# Patient Record
Sex: Female | Born: 1960 | Race: White | Hispanic: No | Marital: Married | Smoking: Never smoker
Health system: Southern US, Community
[De-identification: ages and names within clinical notes are randomized; demographics above are authoritative.]

## PROBLEM LIST (undated history)

## (undated) DIAGNOSIS — B019 Varicella without complication: Secondary | ICD-10-CM

## (undated) DIAGNOSIS — D509 Iron deficiency anemia, unspecified: Secondary | ICD-10-CM

## (undated) DIAGNOSIS — B029 Zoster without complications: Secondary | ICD-10-CM

## (undated) DIAGNOSIS — I10 Essential (primary) hypertension: Secondary | ICD-10-CM

## (undated) DIAGNOSIS — H44003 Unspecified purulent endophthalmitis, bilateral: Secondary | ICD-10-CM

## (undated) DIAGNOSIS — Z87440 Personal history of urinary (tract) infections: Secondary | ICD-10-CM

## (undated) HISTORY — DX: Personal history of urinary (tract) infections: Z87.440

## (undated) HISTORY — PX: GASTRIC BYPASS: SHX52

## (undated) HISTORY — PX: TONSILLECTOMY AND ADENOIDECTOMY: SHX28

## (undated) HISTORY — DX: Iron deficiency anemia, unspecified: D50.9

## (undated) HISTORY — DX: Varicella without complication: B01.9

## (undated) HISTORY — DX: Essential (primary) hypertension: I10

## (undated) HISTORY — PX: ABDOMINAL HYSTERECTOMY: SHX81

## (undated) HISTORY — DX: Zoster without complications: B02.9

---
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
## 2006-03-25 HISTORY — PX: GASTRIC BYPASS: SHX52

## 2008-03-25 HISTORY — PX: ABDOMINAL HYSTERECTOMY: SHX81

## 2012-06-26 LAB — HM COLONOSCOPY

## 2014-04-06 LAB — HM MAMMOGRAPHY: HM MAMMO: NORMAL

## 2014-08-23 ENCOUNTER — Encounter: Payer: Self-pay | Admitting: Primary Care

## 2014-08-23 ENCOUNTER — Ambulatory Visit (INDEPENDENT_AMBULATORY_CARE_PROVIDER_SITE_OTHER): Payer: BLUE CROSS/BLUE SHIELD | Admitting: Primary Care

## 2014-08-23 ENCOUNTER — Encounter (INDEPENDENT_AMBULATORY_CARE_PROVIDER_SITE_OTHER): Payer: Self-pay

## 2014-08-23 VITALS — BP 116/82 | HR 65 | Temp 98.0°F | Ht 68.0 in | Wt 242.8 lb

## 2014-08-23 DIAGNOSIS — E669 Obesity, unspecified: Secondary | ICD-10-CM | POA: Insufficient documentation

## 2014-08-23 DIAGNOSIS — D509 Iron deficiency anemia, unspecified: Secondary | ICD-10-CM

## 2014-08-23 NOTE — Patient Instructions (Signed)
I will obtain records from your prior provider and will schedule a physical once I've had a chance to review. You may try Debrox Drops (over the counter) for ear wax removal.  It was a pleasure to meet you today! Please don't hesitate to call me with any questions. Welcome to [facility]!

## 2014-08-23 NOTE — Progress Notes (Signed)
Subjective:    Patient ID: Judy Price, female    DOB: 06-Jun-1960, 54 y.o.   MRN: [id number]  HPI  Judy Price is a 54 year old female who presents today to establish care and discuss the problems mentioned below. Will obtain old records.  1) Elevated blood pressure readings: She's experienced elevated blood pressure readings prior to her gastric bypass surgery in 2006, but has not had elevated readings since. She's never been managed on medication. Denies chest pain, headaches, blurred vision.  2) Obesity: Gastric Bypass completed in 2006. Gradual weight gain of 40 pounds over the last year which she attributes to stress eating and lack of activity. She plans on focusing on weight loss efforts now that the move from [place] is complete.  3) Iron deficiency anemia: Present since bypass. Iron infusion last year. Reports some fatigue, but not as she did when she required infusions.  Review of Systems  Constitutional: Negative for fatigue and unexpected weight change.  HENT: Negative for rhinorrhea.   Respiratory: Negative for cough and shortness of breath.   Gastrointestinal: Negative for diarrhea and constipation.  Genitourinary: Negative for dysuria and frequency.  Musculoskeletal: Negative for myalgias and arthralgias.  Skin: Negative for rash.  Allergic/Immunologic: Negative for environmental allergies.  Neurological: Negative for dizziness and headaches.  Psychiatric/Behavioral:       Denies concerns for anxiety or depression       Past Medical History  Diagnosis Date  . Chicken pox   . Hypertension   . History of UTI   . Iron deficiency anemia     History   Social History  . Marital Status: Married    Spouse Name: N/A  . Number of Children: N/A  . Years of Education: N/A   Occupational History  . Not on file.   Social History Main Topics  . Smoking status: Never Smoker   . Smokeless tobacco: Not on file  . Alcohol Use: No  . Drug Use: No  . Sexual  Activity: Not on file   Other Topics Concern  . Not on file   Social History Narrative   Married.   Moved from [place].   One child, one grandchild.   Home maker   Enjoys decorating, making jewelry.       Past Surgical History  Procedure Laterality Date  . Gastric bypass    . Abdominal hysterectomy    . Tonsillectomy and adenoidectomy      Family History  Problem Relation Age of Onset  . Arthritis Mother   . Cancer Mother     Breast tumors  . Hyperlipidemia Mother   . Heart disease Mother   . Stroke Mother   . Hypertension Mother   . Hyperlipidemia Father   . Heart disease Father   . Stroke Father   . Arthritis Maternal Grandmother   . Cancer Maternal Grandmother     Breast  . Hyperlipidemia Maternal Grandmother   . Heart disease Maternal Grandmother   . Stroke Maternal Grandmother   . Hypertension Maternal Grandmother   . Hyperlipidemia Maternal Grandfather   . Hyperlipidemia Paternal Grandmother   . Hyperlipidemia Paternal Grandfather     Allergies  Allergen Reactions  . Codeine     No current outpatient prescriptions on file prior to visit.   No current facility-administered medications on file prior to visit.    BP 116/82 mmHg  Pulse 65  Temp(Src) 98 F (36.7 C) (Oral)  Ht 5\' 8"  (1.727 m)  Wt 242  lb 12.8 oz (110.133 kg)  BMI 36.93 kg/m2  SpO2 97%    Objective:   Physical Exam  Constitutional: She is oriented to person, place, and time. She appears well-nourished.  HENT:  Right Ear: Tympanic membrane and ear canal normal.  Left Ear: Tympanic membrane and ear canal normal.  Mouth/Throat: Oropharynx is clear and moist.  Cardiovascular: Normal rate and regular rhythm.   Pulmonary/Chest: Effort normal and breath sounds normal.  Neurological: She is alert and oriented to person, place, and time.  Skin: Skin is warm and dry.  Psychiatric: She has a normal mood and affect.          Assessment & Plan:

## 2014-08-23 NOTE — Progress Notes (Signed)
Pre visit review using our clinic review tool, if applicable. No additional management support is needed unless otherwise documented below in the visit note. 

## 2014-08-23 NOTE — Assessment & Plan Note (Signed)
Gastric bypass in 2006. Steady weight gain of 40 pounds in last year. She is aware of the need for weight loss and exercise and plans on re-starting now that she's moved. Will continue to monitor.

## 2014-08-23 NOTE — Assessment & Plan Note (Signed)
Present since bypass in 2006. Last iron infusion was 1 year ago. Will obtain CBC next visit. Some fatigue but overall feels well.

## 2014-11-23 ENCOUNTER — Telehealth: Payer: Self-pay

## 2014-11-23 NOTE — Telephone Encounter (Signed)
I called and spoke with patient to remind her of being due for a mammogram. Patient states that she recently moved here from [place] and that she had a mammogram at [facility] in [place]. She states that she had it done some time in the middle of January.

## 2014-12-01 ENCOUNTER — Other Ambulatory Visit: Payer: Self-pay | Admitting: *Deleted

## 2014-12-01 MED ORDER — ESTROGENS CONJUGATED 0.625 MG PO TABS: 0.6250 mg | ORAL_TABLET | Freq: Every day | ORAL | Status: DC

## 2014-12-01 NOTE — Telephone Encounter (Signed)
Pt left voicemail at Triage requesting Rx to be filled, done and pt notified

## 2015-01-24 ENCOUNTER — Other Ambulatory Visit: Payer: Self-pay

## 2015-01-24 ENCOUNTER — Telehealth: Payer: Self-pay | Admitting: Primary Care

## 2015-01-24 NOTE — Telephone Encounter (Signed)
Received a refill request for Premarin. I do not manage this medication. Someone must have authorized refills during my absence in September.  Judy Price, What is she taking this medication for? This is something that GYN will prescribe typically.

## 2015-01-24 NOTE — Telephone Encounter (Signed)
Pt left v/m requesting printed rx for premarin; pt can recently got filled at local pharmacy but pt can get less expensive on line. Pt request cb when ready for pick up.

## 2015-01-26 ENCOUNTER — Telehealth: Payer: Self-pay | Admitting: Primary Care

## 2015-01-26 NOTE — Telephone Encounter (Signed)
Called patient. Patient stated she understand [name]'s comments. Patient wanted to know if Jae DireKate would printed out a prescription so she could get it online where it is less expensive. Patient is aware that a GYN typically prescribed this and this medication helps her with hot flashes.

## 2015-01-26 NOTE — Telephone Encounter (Deleted)
Called patient. Patient stated that she wanted a printed written precription

## 2015-01-26 NOTE — Telephone Encounter (Signed)
How long has she been on this medication? Is she aware of the risks of taking this medication which include breast cancer, endometrial cancer, heart disease, and dementia? There are other options out there for management of hot flashes that are less risky. Has she tried anything else in the past?

## 2015-01-27 ENCOUNTER — Telehealth: Payer: Self-pay | Admitting: Primary Care

## 2015-01-27 ENCOUNTER — Other Ambulatory Visit: Payer: Self-pay | Admitting: Primary Care

## 2015-01-27 DIAGNOSIS — N951 Menopausal and female climacteric states: Secondary | ICD-10-CM

## 2015-01-27 MED ORDER — ESTROGENS CONJUGATED 0.45 MG PO TABS: ORAL_TABLET | ORAL | Status: DC

## 2015-01-27 NOTE — Telephone Encounter (Signed)
Noted. She is due for a physical and needs follow up for premarin, please have her schedule with me in 3 months or sooner if she desires. We can do the physical and follow up together. I have reduced her dose. She will continue that same dose for 3 months and will reduce it again after that. She will need close follow up. Thanks.

## 2015-01-27 NOTE — Telephone Encounter (Signed)
Called and spoken to patient. Patient have been taking this medication for about 6 years. She is aware of the risks but since they are so bad that's why she continue to take medication. Patient have tried natural remedies but no other prescriptions. Patient is open to try other things.

## 2015-01-27 NOTE — Telephone Encounter (Signed)
Called patient and she stated that she takes it every day. Notified patient that she can pick up in front office.

## 2015-01-27 NOTE — Telephone Encounter (Signed)
Will you please call Judy Price and verify how often she was taking her Premarin? Every day? Every other day? Also, her new script is ready for pick up.

## 2015-01-31 NOTE — Telephone Encounter (Signed)
Called and notified patient of [name]'s comments. Patient verbalized understanding. Patient will call later to schedule appointment.

## 2015-03-23 ENCOUNTER — Encounter: Payer: Self-pay | Admitting: Family Medicine

## 2015-03-23 ENCOUNTER — Ambulatory Visit (INDEPENDENT_AMBULATORY_CARE_PROVIDER_SITE_OTHER): Payer: BLUE CROSS/BLUE SHIELD | Admitting: Family Medicine

## 2015-03-23 VITALS — BP 110/70 | HR 56 | Temp 98.1°F | Ht 68.0 in

## 2015-03-23 DIAGNOSIS — M7501 Adhesive capsulitis of right shoulder: Secondary | ICD-10-CM | POA: Diagnosis not present

## 2015-03-23 DIAGNOSIS — M25511 Pain in right shoulder: Secondary | ICD-10-CM | POA: Diagnosis not present

## 2015-03-23 MED ORDER — METHYLPREDNISOLONE ACETATE 40 MG/ML IJ SUSP
80.0000 mg | Freq: Once | INTRAMUSCULAR | Status: AC
  Administered 2015-03-23: 80 mg via INTRA_ARTICULAR

## 2015-03-23 NOTE — Progress Notes (Signed)
Dr. Karleen HampshireSpencer T. Mico Spark, MD, CAQ Sports Medicine Primary Care and Sports Medicine [address] [place], [address] Phone: [phone] Fax: [phone]  03/23/2015  Patient: Judy Price, MRN: [id number], DOB: 1960-05-12, 54 y.o.  Primary Physician:  Morrie Sheldonlark,Katherine Kendal, NP   Chief Complaint  Patient presents with  . Shoulder Pain    Right-Wants Injection   Subjective:   Judy Price is a 54 y.o. very pleasant female patient who presents with the following: shoulder pain  Consulting provider: Mrs. Mayra ReelKate Clark, NP Reason: R shoulder pain  The patient noted above presents with shoulder pain that has been ongoing for 8 months. there is no history of trauma or accident other than moving some boxes without pain. The patient denies neck pain or radicular symptoms. No shoulder blade pain Denies dislocation, subluxation, separation of the shoulder. The patient does complain of pain with flexion, abduction, and terminal motion.  Significant restriction of motion. she describes a deep ache around the shoulder, and sometimes it will wake the patient up at night.  Moved in may and hurt ever since. Had a L shoulder injection in the remote past.   Gotten worse since May. R.  Still looking for employment. [place]   No surgery or fracture.  Not sure of an injury.   Medications Tried: OTC Tylenol and NSAIDS Ice or Heat: minimal help Tried PT: No  Prior shoulder Injury: No Prior surgery: No Prior fracture: No  Past Medical History, Surgical History, Social History, Family History, Medications, and allergies reviewed and updated if relevant.   Patient Active Problem List   Diagnosis Date Noted  . Obesity (BMI 35.0-39.9 without comorbidity) (HCC) 08/23/2014  . Anemia, iron deficiency 08/23/2014    Past Medical History  Diagnosis Date  . Chicken pox   . Hypertension   . History of UTI   . Iron deficiency anemia     Past Surgical History    Procedure Laterality Date  . Gastric bypass    . Abdominal hysterectomy    . Tonsillectomy and adenoidectomy      Social History   Social History  . Marital Status: Married    Spouse Name: N/A  . Number of Children: N/A  . Years of Education: N/A   Occupational History  . Not on file.   Social History Main Topics  . Smoking status: Never Smoker   . Smokeless tobacco: Never Used  . Alcohol Use: No  . Drug Use: No  . Sexual Activity: Not on file   Other Topics Concern  . Not on file   Social History Narrative   Married.   Moved from [place].   One child, one grandchild.   Home maker   Enjoys decorating, making jewelry.       Family History  Problem Relation Age of Onset  . Arthritis Mother   . Cancer Mother     Breast tumors  . Hyperlipidemia Mother   . Heart disease Mother   . Stroke Mother   . Hypertension Mother   . Hyperlipidemia Father   . Heart disease Father   . Stroke Father   . Arthritis Maternal Grandmother   . Cancer Maternal Grandmother     Breast  . Hyperlipidemia Maternal Grandmother   . Heart disease Maternal Grandmother   . Stroke Maternal Grandmother   . Hypertension Maternal Grandmother   . Hyperlipidemia Maternal Grandfather   . Hyperlipidemia Paternal Grandmother   . Hyperlipidemia Paternal Grandfather  Allergies  Allergen Reactions  . Codeine     Medication list reviewed and updated in full in [facility].  GEN: No fevers, chills. Nontoxic. Primarily MSK c/o today. MSK: Detailed in the HPI GI: tolerating PO intake without difficulty Neuro: No numbness, parasthesias, or tingling associated. Otherwise the pertinent positives of the ROS are noted above.    Objective:   Blood pressure 110/70, pulse 56, temperature 98.1 F (36.7 C), temperature source Oral, height  (1.727 m).  GEN: Well-developed,well-nourished,in no acute distress; alert,appropriate and cooperative throughout examination HEENT:  Normocephalic and atraumatic without obvious abnormalities. Ears, externally no deformities PULM: Breathing comfortably in no respiratory distress EXT: No clubbing, cyanosis, or edema PSYCH: Normally interactive. Cooperative during the interview. Pleasant. Friendly and conversant. Not anxious or depressed appearing. Normal, full affect.  Shoulder: R Inspection: No muscle wasting or winging Ecchymosis/edema: neg  AC joint, scapula, clavicle: NT Cervical spine: NT, full ROM Spurling's: neg Abduction: 5/5, limited to 135 Flexion: 5/5, 135 deg IR, full, lift-off: 5/5, minimal in 90 abd ER at neutral: 5/5, lacks 20 deg compared to contralateral side AC crossover and compression: unable to complete Additional special testing is equivocal given lack of motion C5-T1 intact Sensation intact Grip 5/5  Assessment and Plan:   Adhesive capsulitis, right  Shoulder pain, right - Plan: methylPREDNISolone acetate (DEPO-MEDROL) injection 80 mg  >25 minutes spent in face to face time with patient, >50% spent in counselling or coordination of care  Patient was given a systematic ROM protocol from [employer] to be done daily. Emphasized importance of adherence, help of PT, daily HEP. In a well-informed young patient, HEP is a reasonable first step.  The average length of total symptoms is 12-18 months going through 3 different phases in the freezing and thawing process. Reviewed all with patient. I believe this is secondary, and she hopefully will thaw faster.   Tylenol or NSAID of choice prn for pain relief Intraarticular shoulder injections discussed with patient, which have good evidence for accelerating the thawing phase.  Will need RTC str and scapular stabilization to fix underlying mechanics.  I appreciate the opportunity to evaluate this very friendly patient. If you have any question regarding her care or prognosis, do not hesitate to ask.   Intrarticular Shoulder Injection, R Verbal consent  was obtained from the patient. Risks including infection explained and contrasted with benefits and alternatives. Patient prepped with Chloraprep and Ethyl Chloride used for anesthesia. An intraarticular shoulder injection was performed using the posterior approach. The patient tolerated the procedure well and had decreased pain post injection. No complications. Injection: 8 cc of Lidocaine 1% and 2 mL Depo-Medrol 40 mg. Needle: 22 gauge   Follow-up: 2 months  Signed,  Kayline Sheer T. Stokely Jeancharles, MD   Patient's Medications  New Prescriptions   No medications on file  Previous Medications   ESTROGENS, CONJUGATED, (PREMARIN) 0.45 MG TABLET    Take 1 tablet by mouth daily.  Modified Medications   No medications on file  Discontinued Medications   No medications on file

## 2015-03-23 NOTE — Progress Notes (Signed)
Pre visit review using our clinic review tool, if applicable. No additional management support is needed unless otherwise documented below in the visit note. 

## 2015-06-08 ENCOUNTER — Encounter: Payer: Self-pay | Admitting: Family Medicine

## 2015-06-08 ENCOUNTER — Ambulatory Visit (INDEPENDENT_AMBULATORY_CARE_PROVIDER_SITE_OTHER): Payer: BLUE CROSS/BLUE SHIELD | Admitting: Family Medicine

## 2015-06-08 VITALS — BP 104/62 | HR 58 | Temp 97.5°F | Ht 68.0 in

## 2015-06-08 DIAGNOSIS — M25511 Pain in right shoulder: Secondary | ICD-10-CM

## 2015-06-08 DIAGNOSIS — M7501 Adhesive capsulitis of right shoulder: Secondary | ICD-10-CM

## 2015-06-08 MED ORDER — METHYLPREDNISOLONE ACETATE 40 MG/ML IJ SUSP
80.0000 mg | Freq: Once | INTRAMUSCULAR | Status: AC
  Administered 2015-06-08: 80 mg via INTRA_ARTICULAR

## 2015-06-08 NOTE — Progress Notes (Signed)
Dr. Karleen HampshireSpencer T. Cory Kitt, MD, CAQ Sports Medicine Primary Care and Sports Medicine [address] [place], [address] Phone: [phone] Fax: [phone]  06/08/2015  Patient: Judy Price, MRN: [id number], DOB: [birth year], 55 y.o.  Primary Physician:  Morrie Sheldonlark,Katherine Kendal, NP   Chief Complaint  Patient presents with  . Follow-up    Right Shoulder Pain   Subjective:   Judy Mawamela Low is a 55 y.o. very pleasant female patient who presents with the following: shoulder pain  Frozen shoulder Getting much better. Compliant with HEP and stretching S/p intrartic shoulder inj x 1  O/w she is doing well.  03/23/2015 Last OV with Hannah BeatSpencer Crystal Ellwood, MD  Consulting provider: Mrs. Mayra ReelKate Clark, NP Reason: R shoulder pain  The patient noted above presents with shoulder pain that has been ongoing for 8 months. there is no history of trauma or accident other than moving some boxes without pain. The patient denies neck pain or radicular symptoms. No shoulder blade pain Denies dislocation, subluxation, separation of the shoulder. The patient does complain of pain with flexion, abduction, and terminal motion.  Significant restriction of motion. she describes a deep ache around the shoulder, and sometimes it will wake the patient up at night.  Moved in may and hurt ever since. Had a L shoulder injection in the remote past.   Gotten worse since May. R.  Still looking for employment. [place]   No surgery or fracture.  Not sure of an injury.   Medications Tried: OTC Tylenol and NSAIDS Ice or Heat: minimal help Tried PT: No  Prior shoulder Injury: No Prior surgery: No Prior fracture: No  Past Medical History, Surgical History, Social History, Family History, Medications, and allergies reviewed and updated if relevant.   Patient Active Problem List   Diagnosis Date Noted  . Obesity (BMI 35.0-39.9 without comorbidity) (HCC) 08/23/2014  . Anemia, iron deficiency  08/23/2014    Past Medical History  Diagnosis Date  . Chicken pox   . Hypertension   . History of UTI   . Iron deficiency anemia     Past Surgical History  Procedure Laterality Date  . Gastric bypass    . Abdominal hysterectomy    . Tonsillectomy and adenoidectomy      Social History   Social History  . Marital Status: Married    Spouse Name: N/A  . Number of Children: N/A  . Years of Education: N/A   Occupational History  . Not on file.   Social History Main Topics  . Smoking status: Never Smoker   . Smokeless tobacco: Never Used  . Alcohol Use: No  . Drug Use: No  . Sexual Activity: Not on file   Other Topics Concern  . Not on file   Social History Narrative   Married.   Moved from [place].   One child, one grandchild.   Home maker   Enjoys decorating, making jewelry.       Family History  Problem Relation Age of Onset  . Arthritis Mother   . Cancer Mother     Breast tumors  . Hyperlipidemia Mother   . Heart disease Mother   . Stroke Mother   . Hypertension Mother   . Hyperlipidemia Father   . Heart disease Father   . Stroke Father   . Arthritis Maternal Grandmother   . Cancer Maternal Grandmother     Breast  . Hyperlipidemia Maternal Grandmother   . Heart disease Maternal Grandmother   .  Stroke Maternal Grandmother   . Hypertension Maternal Grandmother   . Hyperlipidemia Maternal Grandfather   . Hyperlipidemia Paternal Grandmother   . Hyperlipidemia Paternal Grandfather     Allergies  Allergen Reactions  . Codeine     Medication list reviewed and updated in full in [facility].  GEN: No fevers, chills. Nontoxic. Primarily MSK c/o today. MSK: Detailed in the HPI GI: tolerating PO intake without difficulty Neuro: No numbness, parasthesias, or tingling associated. Otherwise the pertinent positives of the ROS are noted above.    Objective:   Blood pressure 104/62, pulse 58, temperature 97.5 F (36.4 C), temperature source  Oral, height  (1.727 m).  GEN: Well-developed,well-nourished,in no acute distress; alert,appropriate and cooperative throughout examination HEENT: Normocephalic and atraumatic without obvious abnormalities. Ears, externally no deformities PULM: Breathing comfortably in no respiratory distress EXT: No clubbing, cyanosis, or edema PSYCH: Normally interactive. Cooperative during the interview. Pleasant. Friendly and conversant. Not anxious or depressed appearing. Normal, full affect.  Shoulder: R Inspection: No muscle wasting or winging Ecchymosis/edema: neg  AC joint, scapula, clavicle: NT Cervical spine: NT, full ROM Spurling's: neg Abduction: 5/5, limited to 170 Flexion: 5/5, 165 deg IR, full, lift-off: 5/5, minimal in 90 abd ER at neutral: 5/5, lacks 5 deg compared to contralateral side AC crossover and compression: unable to complete Additional special testing is equivocal given lack of motion C5-T1 intact Sensation intact Grip 5/5  Assessment and Plan:   Adhesive capsulitis, right  Right shoulder pain - Plan: methylPREDNISolone acetate (DEPO-MEDROL) injection 80 mg  ROM is doing much better, compliant with HEP Reinject shoulder again today  F/u prn or with probs  Intrarticular Shoulder Injection, R Verbal consent was obtained from the patient. Risks including infection explained and contrasted with benefits and alternatives. Patient prepped with Chloraprep and Ethyl Chloride used for anesthesia. An intraarticular shoulder injection was performed using the posterior approach. The patient tolerated the procedure well and had decreased pain post injection. No complications. Injection: 8 cc of Lidocaine 1% and 2 mL Depo-Medrol 40 mg. Needle: 22 gauge   Follow-up: 2 months  Signed,  Kourtlyn Charlet T. Arty Lantzy, MD   Patient's Medications  New Prescriptions   No medications on file  Previous Medications   ESTROGENS, CONJUGATED, (PREMARIN) 0.45 MG TABLET    Take 1 tablet by  mouth daily.  Modified Medications   No medications on file  Discontinued Medications   No medications on file

## 2015-06-08 NOTE — Progress Notes (Signed)
Pre visit review using our clinic review tool, if applicable. No additional management support is needed unless otherwise documented below in the visit note. 

## 2015-07-25 ENCOUNTER — Other Ambulatory Visit: Payer: Self-pay | Admitting: Primary Care

## 2015-07-25 ENCOUNTER — Telehealth: Payer: Self-pay | Admitting: Primary Care

## 2015-07-25 NOTE — Telephone Encounter (Signed)
Electronically refill request for   estrogens, conjugated, (PREMARIN) 0.45 MG tablet    Last prescribed on 01/27/2015. Last seen on 06/08/2015. No future appointment.

## 2015-07-25 NOTE — Telephone Encounter (Signed)
Message left for patient to return my call.  

## 2015-07-25 NOTE — Telephone Encounter (Signed)
Received refill request for Premarin. As mentioned in my prior notes I would like to wean her off of this medication and as long term management puts her at risk. She was to schedule a follow up appointment to discuss, but never did. I would like to further reduce the strenth of her medication by sending in Premarin 0.3 mg. I would like for her to take this dose for 3 months and notify me if she develops any symptoms. Does she wish to have this script printed like her last one?

## 2015-07-27 ENCOUNTER — Other Ambulatory Visit: Payer: Self-pay | Admitting: Primary Care

## 2015-07-27 DIAGNOSIS — N951 Menopausal and female climacteric states: Secondary | ICD-10-CM

## 2015-07-27 MED ORDER — ESTROGENS CONJUGATED 0.3 MG PO TABS: 0.3000 mg | ORAL_TABLET | Freq: Every day | ORAL | Status: DC

## 2015-07-27 NOTE — Telephone Encounter (Signed)
Noted.  Rx sent.

## 2015-07-27 NOTE — Telephone Encounter (Signed)
Called and notified patient of [name]'s comments. Patient verbalized understanding.  Patient agrees to the premarin 0.3 mg and would like it sent to the [facility]. Will call back later for the follow up.

## 2015-09-29 ENCOUNTER — Ambulatory Visit (INDEPENDENT_AMBULATORY_CARE_PROVIDER_SITE_OTHER): Payer: BLUE CROSS/BLUE SHIELD | Admitting: Internal Medicine

## 2015-09-29 ENCOUNTER — Encounter: Payer: Self-pay | Admitting: Internal Medicine

## 2015-09-29 VITALS — BP 120/84 | HR 65 | Temp 98.2°F

## 2015-09-29 DIAGNOSIS — R1013 Epigastric pain: Secondary | ICD-10-CM | POA: Diagnosis not present

## 2015-09-29 LAB — CBC WITH DIFFERENTIAL/PLATELET
Basophils Absolute: 0 10*3/uL (ref 0.0–0.1)
Basophils Relative: 0.5 % (ref 0.0–3.0)
EOS PCT: 1.1 % (ref 0.0–5.0)
Eosinophils Absolute: 0.1 10*3/uL (ref 0.0–0.7)
HCT: 39 % (ref 36.0–46.0)
Hemoglobin: 13 g/dL (ref 12.0–15.0)
LYMPHS ABS: 1.4 10*3/uL (ref 0.7–4.0)
Lymphocytes Relative: 19.7 % (ref 12.0–46.0)
MCHC: 33.3 g/dL (ref 30.0–36.0)
MCV: 84.5 fl (ref 78.0–100.0)
MONO ABS: 0.6 10*3/uL (ref 0.1–1.0)
Monocytes Relative: 9 % (ref 3.0–12.0)
NEUTROS ABS: 5 10*3/uL (ref 1.4–7.7)
NEUTROS PCT: 69.7 % (ref 43.0–77.0)
PLATELETS: 252 10*3/uL (ref 150.0–400.0)
RBC: 4.61 Mil/uL (ref 3.87–5.11)
RDW: 15.4 % (ref 11.5–15.5)
WBC: 7.2 10*3/uL (ref 4.0–10.5)

## 2015-09-29 LAB — COMPREHENSIVE METABOLIC PANEL
ALT: 17 U/L (ref 0–35)
AST: 19 U/L (ref 0–37)
Albumin: 4.1 g/dL (ref 3.5–5.2)
Alkaline Phosphatase: 50 U/L (ref 39–117)
BUN: 20 mg/dL (ref 6–23)
CO2: 33 meq/L — AB (ref 19–32)
Calcium: 10.3 mg/dL (ref 8.4–10.5)
Chloride: 103 mEq/L (ref 96–112)
Creatinine, Ser: 0.8 mg/dL (ref 0.40–1.20)
GFR: 78.69 mL/min (ref >60.00)
GLUCOSE: 97 mg/dL (ref 70–99)
Potassium: 4.7 mEq/L (ref 3.5–5.1)
SODIUM: 139 meq/L (ref 135–145)
Total Bilirubin: 0.4 mg/dL (ref 0.2–1.2)
Total Protein: 7.2 g/dL (ref 6.0–8.3)

## 2015-09-29 LAB — IBC PANEL
Iron: 55 ug/dL (ref 42–145)
SATURATION RATIOS: 15.5 % — AB (ref 20.0–50.0)
Transferrin: 254 mg/dL (ref 212.0–360.0)

## 2015-09-29 LAB — VITAMIN B12

## 2015-09-29 MED ORDER — OMEPRAZOLE 20 MG PO CPDR: 20.0000 mg | DELAYED_RELEASE_CAPSULE | Freq: Every day | ORAL | Status: DC

## 2015-09-29 NOTE — Progress Notes (Signed)
   Subjective:    Patient ID: Judy Price, female    DOB: 31-Aug-1960, 55 y.o.   MRN: [id number]  HPI Here due to stomach burning  Has been burping and burning pain in epigastric area Started 2-3 days ago Has been taking protein shakes and only 1 meal a day for 3 months Has lost 30# with this Had Roux-n-Y bypass some years ago  Still on her bariatric surgery vitamins Needed iron infusions in the past  Regular caffeine in drinks No cigarettes or alcohol No N/V Bowels have been okay---no black or bloody stool  Tried tums last night--no sig help  Current Outpatient Prescriptions on File Prior to Visit  Medication Sig Dispense Refill  . estrogens, conjugated, (PREMARIN) 0.3 MG tablet Take 1 tablet (0.3 mg total) by mouth daily. 90 tablet 0   No current facility-administered medications on file prior to visit.    Allergies  Allergen Reactions  . Codeine     Past Medical History  Diagnosis Date  . Chicken pox   . Hypertension   . History of UTI   . Iron deficiency anemia     Past Surgical History  Procedure Laterality Date  . Gastric bypass    . Abdominal hysterectomy    . Tonsillectomy and adenoidectomy      Family History  Problem Relation Age of Onset  . Arthritis Mother   . Cancer Mother     Breast tumors  . Hyperlipidemia Mother   . Heart disease Mother   . Stroke Mother   . Hypertension Mother   . Hyperlipidemia Father   . Heart disease Father   . Stroke Father   . Arthritis Maternal Grandmother   . Cancer Maternal Grandmother     Breast  . Hyperlipidemia Maternal Grandmother   . Heart disease Maternal Grandmother   . Stroke Maternal Grandmother   . Hypertension Maternal Grandmother   . Hyperlipidemia Maternal Grandfather   . Hyperlipidemia Paternal Grandmother   . Hyperlipidemia Paternal Grandfather     Social History   Social History  . Marital Status: Married    Spouse Name: N/A  . Number of Children: N/A  . Years of Education: N/A    Occupational History  . Not on file.   Social History Main Topics  . Smoking status: Never Smoker   . Smokeless tobacco: Never Used  . Alcohol Use: No  . Drug Use: No  . Sexual Activity: Not on file   Other Topics Concern  . Not on file   Social History Narrative   Married.   [place]oved from [place].   One child, one grandchild.   Home maker   Enjoys decorating, making jewelry.      Review of Systems Feels good--just tired some No cough or  Appetite is okay    Objective:   Physical Exam  Constitutional: She appears well-developed. No distress.  Neck: Normal range of motion. No thyromegaly present.  Pulmonary/Chest: Effort normal and breath sounds normal. No respiratory distress. She has no wheezes. She has no rales.  Abdominal: Soft. Bowel sounds are normal. She exhibits no distension. There is no tenderness. There is no rebound and no guarding.  Lymphadenopathy:    She has no cervical adenopathy.          Assessment & Plan:

## 2015-09-29 NOTE — Assessment & Plan Note (Signed)
Sounds like acid related Will have her try to decrease the caffeine Trial of omeprazole --may only need for 2 weeks Will check labs also

## 2015-09-29 NOTE — Patient Instructions (Signed)
If your symptoms are better with the first couple of doses of medication, you can stop after 2 weeks. Cut back on the caffeine.

## 2015-09-29 NOTE — Progress Notes (Signed)
Pre visit review using our clinic review tool, if applicable. No additional management support is needed unless otherwise documented below in the visit note. 

## 2015-11-03 ENCOUNTER — Other Ambulatory Visit: Payer: Self-pay | Admitting: Primary Care

## 2015-11-03 DIAGNOSIS — N951 Menopausal and female climacteric states: Secondary | ICD-10-CM

## 2015-11-30 ENCOUNTER — Other Ambulatory Visit: Payer: Self-pay | Admitting: Primary Care

## 2015-11-30 DIAGNOSIS — N951 Menopausal and female climacteric states: Secondary | ICD-10-CM

## 2015-11-30 NOTE — Telephone Encounter (Signed)
Will send 30 days.  Per DPR, left detail message for patient to schedule a follow up.  Sending patient a MyChart message to schedule a follow up.  Sending a letter regarding the refill request.  Due to patient have not seen Jae DireKate regarding this issue over 15 months is the reason for comments above.

## 2015-12-31 ENCOUNTER — Other Ambulatory Visit: Payer: Self-pay | Admitting: Primary Care

## 2015-12-31 DIAGNOSIS — N951 Menopausal and female climacteric states: Secondary | ICD-10-CM

## 2016-01-02 NOTE — Telephone Encounter (Signed)
Ok to refill? Electronically refill request for   PREMARIN 0.3 MG tablet  Last prescribed on 11/30/2015. Last seen on 09/29/2015 wit acute with Dr Alphonsus SiasLetvak. Next appt on 01/03/2016.

## 2016-01-03 ENCOUNTER — Ambulatory Visit (INDEPENDENT_AMBULATORY_CARE_PROVIDER_SITE_OTHER): Payer: BLUE CROSS/BLUE SHIELD | Admitting: Primary Care

## 2016-01-03 ENCOUNTER — Encounter: Payer: Self-pay | Admitting: Primary Care

## 2016-01-03 VITALS — BP 106/64 | HR 62 | Temp 97.9°F | Ht 68.0 in | Wt 177.0 lb

## 2016-01-03 DIAGNOSIS — Z79890 Hormone replacement therapy (postmenopausal): Secondary | ICD-10-CM

## 2016-01-03 DIAGNOSIS — N951 Menopausal and female climacteric states: Secondary | ICD-10-CM

## 2016-01-03 DIAGNOSIS — E669 Obesity, unspecified: Secondary | ICD-10-CM

## 2016-01-03 DIAGNOSIS — R5383 Other fatigue: Secondary | ICD-10-CM | POA: Diagnosis not present

## 2016-01-03 DIAGNOSIS — R232 Flushing: Secondary | ICD-10-CM | POA: Insufficient documentation

## 2016-01-03 MED ORDER — ESTROGENS CONJUGATED 0.3 MG PO TABS: ORAL_TABLET | ORAL | 0 refills | Status: DC

## 2016-01-03 NOTE — Progress Notes (Signed)
Pre visit review using our clinic review tool, if applicable. No additional management support is needed unless otherwise documented below in the visit note. 

## 2016-01-03 NOTE — Progress Notes (Signed)
Subjective:    Patient ID: Judy Price, female    DOB: 07/23/1960, 55 y.o.   MRN: [id number]  HPI  Judy Price is a 55 year old female who presents today with multiple complaints.   1) HRT: Currently managed on Premarin 0.3 mg which was reduced 3 months ago from a prior dose of 0.6 mg. She has done well at this reduced dose and denies vasomotor symptoms including hot flashes, irritability, unexplained weight gain. She has noticed fatigue.  2) Fatigue: Present for the past 4 months. Her fatigue is consistent. She has been working on weight loss and has lost 50 pounds since her last visit. She believes she may be eating too few calories as she sticks to 1000 calories daily. She had gastric bybass surgery over 10 years ago. She has noticed brittle nails, hair thinning. She's been taking Vitamin D 4000 units daily for the past several months as she thought she may have a deficiency. She has been under a lot of stress with her son for the past 3 weeks. She does endorse daily worry, anxiety, muscle tension for years.    Her diet currently consists of: Breakfast: Protein shake, veggie patty Lunch: Protein shake Dinner: Beef with califlour rice, protein shake Snack: None Beverages: Water  Exercise: She walks/jogs several times weekly on average.   Wt Readings from Last 3 Encounters:  01/03/16 177 lb (80.3 kg)  08/23/14 242 lb 12.8 oz (110.1 kg)     Review of Systems  Constitutional: Positive for fatigue. Negative for chills, fever and unexpected weight change.  Respiratory: Negative for shortness of breath.   Cardiovascular: Negative for chest pain.  Genitourinary:       Denies hot flashes  Skin:       Brittle nails, hair thinning.  Neurological: Negative for headaches.  Hematological: Negative for adenopathy.  Psychiatric/Behavioral: Negative for sleep disturbance. The patient is nervous/anxious.        Past Medical History:  Diagnosis Date  . Chicken pox   . History of UTI     . Hypertension   . Iron deficiency anemia      Social History   Social History  . Marital status: Married    Spouse name: N/A  . Number of children: N/A  . Years of education: N/A   Occupational History  . Not on file.   Social History Main Topics  . Smoking status: Never Smoker  . Smokeless tobacco: Never Used  . Alcohol use No  . Drug use: No  . Sexual activity: Not on file   Other Topics Concern  . Not on file   Social History Narrative   Married.   Moved from [place].   One child, one grandchild.   Home maker   Enjoys decorating, making jewelry.       Past Surgical History:  Procedure Laterality Date  . ABDOMINAL HYSTERECTOMY    . GASTRIC BYPASS    . TONSILLECTOMY AND ADENOIDECTOMY      Family History  Problem Relation Age of Onset  . Arthritis Mother   . Cancer Mother     Breast tumors  . Hyperlipidemia Mother   . Heart disease Mother   . Stroke Mother   . Hypertension Mother   . Hyperlipidemia Father   . Heart disease Father   . Stroke Father   . Arthritis Maternal Grandmother   . Cancer Maternal Grandmother     Breast  . Hyperlipidemia Maternal Grandmother   . Heart disease  Maternal Grandmother   . Stroke Maternal Grandmother   . Hypertension Maternal Grandmother   . Hyperlipidemia Maternal Grandfather   . Hyperlipidemia Paternal Grandmother   . Hyperlipidemia Paternal Grandfather     Allergies  Allergen Reactions  . Codeine     No current outpatient prescriptions on file prior to visit.   No current facility-administered medications on file prior to visit.     BP 106/64   Pulse 62   Temp 97.9 F (36.6 C) (Oral)   Ht 5\' 8"  (1.727 m)   Wt 177 lb (80.3 kg)   SpO2 97%   BMI 26.91 kg/m    Objective:   Physical Exam  Constitutional: She is oriented to person, place, and time. She appears well-nourished. She does not appear ill.  Neck: Neck supple.  Cardiovascular: Normal rate and regular rhythm.   Pulmonary/Chest:  Effort normal and breath sounds normal.  Neurological: She is alert and oriented to person, place, and time.  Skin: Skin is warm and dry.  Noted brittle nails to fingers.  Psychiatric: She has a normal mood and affect.          Assessment & Plan:

## 2016-01-03 NOTE — Assessment & Plan Note (Signed)
65 pound weight loss through healthy diet and exercise. Commended her on these efforts. Discussed that she may be eating too few calories which could be contributing to fatigue.

## 2016-01-03 NOTE — Patient Instructions (Addendum)
Start taking your Premarin 0.3 mg every other day for 3 months. Please notify me if you have any symptoms or problems with this reduction in dose.  Complete lab work prior to leaving today. I will notify you of your results once received.   [place]   It was a pleasure to see you today!

## 2016-01-03 NOTE — Assessment & Plan Note (Signed)
Managed on premarin tablets for years. Currently working on weaning off. Reduced dose last visit to 0.3 mg without vasomotor symptoms. Will further continue to wean off by taking 0.3 mg every other day for 3 months, then re-evaluate.

## 2016-01-03 NOTE — Assessment & Plan Note (Signed)
Ongoing for the past 4068m onths. Will check IBC panel, TSH, CMP, Vitamin B 12 and D to rule out metabolic cause. Likely due to anxiety and stress and/or too few calories daily. Discussed stress/anxiety reduction techniques as she is not interested in CBT or medication.  Will await lab results.

## 2016-01-04 LAB — BASIC METABOLIC PANEL
BUN: 22 mg/dL (ref 6–23)
CALCIUM: 9.8 mg/dL (ref 8.4–10.5)
CO2: 32 meq/L (ref 19–32)
CREATININE: 0.92 mg/dL (ref 0.40–1.20)
Chloride: 105 mEq/L (ref 96–112)
GFR: 66.91 mL/min (ref >60.00)
GLUCOSE: 101 mg/dL — AB (ref 70–99)
Potassium: 5.1 mEq/L (ref 3.5–5.1)
SODIUM: 142 meq/L (ref 135–145)

## 2016-01-04 LAB — IBC PANEL
Iron: 38 ug/dL — ABNORMAL LOW (ref 42–145)
Saturation Ratios: 11.8 % — ABNORMAL LOW (ref 20.0–50.0)
Transferrin: 230 mg/dL (ref 212.0–360.0)

## 2016-01-04 LAB — VITAMIN B12: VITAMIN B 12: 837 pg/mL (ref 211–911)

## 2016-01-04 LAB — TSH: TSH: 1.34 u[IU]/mL (ref 0.35–4.50)

## 2016-01-04 LAB — VITAMIN D 25 HYDROXY (VIT D DEFICIENCY, FRACTURES): VITD: 84.34 ng/mL (ref 30.00–100.00)

## 2016-01-17 ENCOUNTER — Telehealth: Payer: Self-pay

## 2016-01-17 DIAGNOSIS — R232 Flushing: Secondary | ICD-10-CM

## 2016-01-17 NOTE — Telephone Encounter (Signed)
Pt left vm; pt seen 01/03/16 and premarin was to be used every other day. Pt said now the hot flashes are back and should pt take premarin daily. Pt request cb.

## 2016-01-17 NOTE — Telephone Encounter (Signed)
She may resume taking her medication every day for now. I do recommend we try her on non-hormonal replacement treatment for treatment of her hot flashes while we continue to wean her down. This will hopefully prevent the return of hot flashes. I'm recommend Paxil (paroxetine) tablets. Please notify me if she's agreeable and I will send this to her pharmacy.

## 2016-01-18 MED ORDER — PAROXETINE HCL ER 12.5 MG PO TB24: 12.5000 mg | ORAL_TABLET | Freq: Every day | ORAL | 1 refills | Status: DC

## 2016-01-18 MED ORDER — ESTROGENS CONJUGATED 0.3 MG PO TABS: 0.3000 mg | ORAL_TABLET | Freq: Every day | ORAL | 0 refills | Status: DC

## 2016-01-18 NOTE — Telephone Encounter (Signed)
Please notify patient:  1. I sent a prescription for Paxil CR 12.5 mg tablets to her pharmacy which is used to treat hot flashes. Take 1 tablet by mouth once daily.  2. I sent a new prescription for Premarin 0.3 mg tablets for once daily dosing as her current prescription is for every other day.   I would like for her to resume her premarin once daily for 30 days (new prescription). She also needs to start her Paxil CR now and take with the Premarin daily. I would then like for her to take the Premarin every other day again once she's completed this new 30 day prescription. Please have her update us in several weeks with any progress or problems. The paxil could cause side effects of headache, nausea, GI upset which will typically pass in 1-2 weeks. Also, may cause suicidal thoughts (rare) but please have her notify me if this occurs.

## 2016-01-18 NOTE — Telephone Encounter (Signed)
Spoken and notified patient of [name]'s comments. Patient verbalized understanding. 

## 2016-01-18 NOTE — Telephone Encounter (Signed)
Spoken and notified patient of [name]'s comments. Patient verbalized understanding.  Patient stated what Judy Price wants her to do, the current prescription of premarin is only enough for 15 days. Can Judy Price send some more for the month.  Patient is also agreeable to the Paxil.

## 2016-02-05 NOTE — Addendum Note (Signed)
Addended by: Tawnya CrookSAMBATH, Dontell Mian on: 02/05/2016 09:56 AM   Modules accepted: Orders

## 2016-03-03 ENCOUNTER — Other Ambulatory Visit: Payer: Self-pay | Admitting: Primary Care

## 2016-03-03 DIAGNOSIS — R232 Flushing: Secondary | ICD-10-CM

## 2016-03-04 ENCOUNTER — Other Ambulatory Visit: Payer: Self-pay | Admitting: Primary Care

## 2016-03-04 ENCOUNTER — Telehealth: Payer: Self-pay | Admitting: Primary Care

## 2016-03-04 DIAGNOSIS — R232 Flushing: Secondary | ICD-10-CM

## 2016-03-04 NOTE — Telephone Encounter (Signed)
Received a refill request for Premarin for which we are attempting to wean off.  1. Did she fill the Paxil for hot flashes? If so, did this help? 2. I still strongly recommend we wean her off of Premarin. I would like for her to start every other day on the Premarin along with the Paxil if she had no problem with the Paxil. Is she agreeable?  Let me know.

## 2016-03-04 NOTE — Telephone Encounter (Signed)
Patient left a voicemail stating that she requested a refill on her Premarin from her pharmacy yesterday. Patient stated that she is out. Last refill #30 was given.  Refer back to last office notes. Okay to refill?

## 2016-03-05 NOTE — Telephone Encounter (Signed)
Please notify patient that medications such as Paxil and Effexor (both antidepressants used off label for treatment of hot flashes) have been very successful in treating hot flashes. I do respect her decision if she does not wish to try.  Another option would be Gabapentin. This is an anticonvulsant used to treat hot flashes amongst many other symptoms/conditions. It would be a good option for her to take at bedtime, especially if hot flashes were worse at that time.  Let me know if she's willing to try the gabapentin.

## 2016-03-05 NOTE — Telephone Encounter (Signed)
Spoke to pt. She said she will think about the different options and let us know what she decides.

## 2016-03-05 NOTE — Telephone Encounter (Signed)
Spoke with patient. She said she never filled the paxil because she decided she didn't want to take it due to it being an antidepressant. She said if there was something else that you could prescribe that wasn't an antidepressant, then she would be willing to try it.

## 2016-04-18 ENCOUNTER — Encounter: Payer: Self-pay | Admitting: Primary Care

## 2016-04-18 DIAGNOSIS — R232 Flushing: Secondary | ICD-10-CM

## 2016-04-18 NOTE — Telephone Encounter (Signed)
Pt left v/m requesting cb [phone] to verify [facility], last refilled # 30 x 11 on 03/05/16. Please see phone note about possibly weaning pt off premarin dated 03/04/16.Please advise.

## 2016-04-19 MED ORDER — ESTROGENS CONJUGATED 0.3 MG PO TABS: ORAL_TABLET | ORAL | 0 refills | Status: DC

## 2016-07-23 ENCOUNTER — Encounter: Payer: Self-pay | Admitting: Primary Care

## 2016-07-23 DIAGNOSIS — R232 Flushing: Secondary | ICD-10-CM

## 2016-07-23 MED ORDER — ESTROGENS CONJUGATED 0.3 MG PO TABS: 0.3000 mg | ORAL_TABLET | Freq: Every day | ORAL | 0 refills | Status: DC

## 2016-07-23 MED ORDER — PAROXETINE HCL ER 12.5 MG PO TB24: ORAL_TABLET | ORAL | 0 refills | Status: DC

## 2016-08-21 ENCOUNTER — Telehealth: Payer: BLUE CROSS/BLUE SHIELD | Admitting: Family

## 2016-08-21 DIAGNOSIS — J329 Chronic sinusitis, unspecified: Secondary | ICD-10-CM

## 2016-08-21 MED ORDER — AMOXICILLIN-POT CLAVULANATE 875-125 MG PO TABS: 1.0000 | ORAL_TABLET | Freq: Two times a day (BID) | ORAL | 0 refills | Status: DC

## 2016-08-21 NOTE — Progress Notes (Signed)

## 2016-10-15 ENCOUNTER — Other Ambulatory Visit: Payer: Self-pay | Admitting: Primary Care

## 2016-10-15 DIAGNOSIS — R232 Flushing: Secondary | ICD-10-CM

## 2016-10-15 NOTE — Telephone Encounter (Signed)
Ok to refill? Electronically refill request for   Premarin 0.3 mg tablet  Last prescribed on 07/23/2016.  Paroxetine 12.5 mg tablet   Last prescribed on 07/23/2016

## 2016-10-15 NOTE — Telephone Encounter (Signed)
Did she ever fill the Paxil? If so then any improvement with hot flashes? How often is she taking the Premarin?

## 2016-10-16 ENCOUNTER — Encounter: Payer: Self-pay | Admitting: Primary Care

## 2016-10-16 NOTE — Telephone Encounter (Signed)
Per DPR, left detail message of [name]'s comments for patient to call back.  Send patient a message through MyChart.

## 2016-10-18 ENCOUNTER — Other Ambulatory Visit: Payer: Self-pay | Admitting: Primary Care

## 2016-10-18 MED ORDER — ESTROGENS CONJUGATED 0.3 MG PO TABS: 0.3000 mg | ORAL_TABLET | Freq: Every day | ORAL | 1 refills | Status: DC

## 2016-10-18 MED ORDER — PAROXETINE HCL ER 12.5 MG PO TB24: ORAL_TABLET | ORAL | 1 refills | Status: DC

## 2016-10-18 NOTE — Telephone Encounter (Signed)
Called and spoke with patient this afternoon. She has tried taking Premarin every other day and continues to experience hot flashes. Doing better with Paxil. Discussed potential long term effects of estrogen use including breast cancer, heart disease, she verbalized understanding and would like to proceed with Premarin and Paxil. Refill for Premarin sent to pharmacy.

## 2016-10-18 NOTE — Telephone Encounter (Signed)
Patient sent a message through MyChart waking to have these refills.

## 2016-10-18 NOTE — Telephone Encounter (Signed)
Please get the answer to my questions. Thanks. The goal was to come off of the Premarin given that the Paxil has helped with the hot flashes. How often is she taking Premarin? Has she tried every other day now that she's got the Paxil?

## 2017-01-06 ENCOUNTER — Telehealth: Payer: BLUE CROSS/BLUE SHIELD | Admitting: Family

## 2017-01-06 DIAGNOSIS — J028 Acute pharyngitis due to other specified organisms: Secondary | ICD-10-CM

## 2017-01-06 DIAGNOSIS — B9689 Other specified bacterial agents as the cause of diseases classified elsewhere: Secondary | ICD-10-CM

## 2017-01-06 MED ORDER — PREDNISONE 5 MG PO TABS: 5.0000 mg | ORAL_TABLET | ORAL | 0 refills | Status: DC

## 2017-01-06 MED ORDER — AZITHROMYCIN 250 MG PO TABS: ORAL_TABLET | ORAL | 0 refills | Status: DC

## 2017-01-06 MED ORDER — BENZONATATE 100 MG PO CAPS: 100.0000 mg | ORAL_CAPSULE | Freq: Three times a day (TID) | ORAL | 0 refills | Status: DC | PRN

## 2017-01-06 NOTE — Progress Notes (Signed)

## 2017-04-08 ENCOUNTER — Ambulatory Visit: Payer: BLUE CROSS/BLUE SHIELD | Admitting: Primary Care

## 2017-04-23 ENCOUNTER — Ambulatory Visit: Payer: Self-pay | Admitting: Primary Care

## 2017-05-02 ENCOUNTER — Ambulatory Visit (INDEPENDENT_AMBULATORY_CARE_PROVIDER_SITE_OTHER): Payer: BLUE CROSS/BLUE SHIELD | Admitting: Primary Care

## 2017-05-02 ENCOUNTER — Ambulatory Visit (INDEPENDENT_AMBULATORY_CARE_PROVIDER_SITE_OTHER)
Admission: RE | Admit: 2017-05-02 | Discharge: 2017-05-02 | Disposition: A | Discharge status: Home / Self Care | Payer: BLUE CROSS/BLUE SHIELD | Source: Ambulatory Visit | Attending: Primary Care | Admitting: Primary Care

## 2017-05-02 ENCOUNTER — Encounter: Payer: Self-pay | Admitting: Primary Care

## 2017-05-02 ENCOUNTER — Ambulatory Visit: Admission: RE | Admit: 2017-05-02 | Payer: BLUE CROSS/BLUE SHIELD | Source: Ambulatory Visit

## 2017-05-02 VITALS — BP 116/66 | HR 58 | Temp 98.4°F

## 2017-05-02 DIAGNOSIS — G8929 Other chronic pain: Secondary | ICD-10-CM | POA: Diagnosis not present

## 2017-05-02 DIAGNOSIS — R232 Flushing: Secondary | ICD-10-CM | POA: Diagnosis not present

## 2017-05-02 DIAGNOSIS — M542 Cervicalgia: Secondary | ICD-10-CM

## 2017-05-02 DIAGNOSIS — M25511 Pain in right shoulder: Secondary | ICD-10-CM

## 2017-05-02 DIAGNOSIS — Z79890 Hormone replacement therapy (postmenopausal): Secondary | ICD-10-CM

## 2017-05-02 DIAGNOSIS — M25519 Pain in unspecified shoulder: Secondary | ICD-10-CM

## 2017-05-02 MED ORDER — ESTROGENS CONJUGATED 0.3 MG PO TABS: ORAL_TABLET | ORAL | 0 refills | Status: DC

## 2017-05-02 MED ORDER — PAROXETINE HCL ER 12.5 MG PO TB24: ORAL_TABLET | ORAL | 1 refills | Status: DC

## 2017-05-02 NOTE — Assessment & Plan Note (Signed)
Likely secondary to overuse with lifting her grandchildren and perhaps to neck. Check plain films of neck. She will schedule an appointment with Sports Medicine for injection as this was helpful in the past. Exercises provided today.

## 2017-05-02 NOTE — Assessment & Plan Note (Signed)
Stable on current regimen, would like to see her off of Premarin. Will have her start with every other day dosing of Premarin, continue Paxil daily. She will update.

## 2017-05-02 NOTE — Assessment & Plan Note (Signed)
Check plain films today. Discussed massage and stretching.

## 2017-05-02 NOTE — Progress Notes (Signed)
Subjective:    Patient ID: Judy Price, female    DOB: Jun 02, 1960, 57 y.o.   MRN: [id number]  HPI  Judy Price is a 57 year old female who presents today with multiple complaints.  1) Neck and Shoulder Pain: Located to the posterior and right lateral neck and right shoulder that has been present for about 8 months. She does pick up her grandchildren often, moves furniture often, denies recent injury/trauma. She had a shoulder injection one year ago with improvement.   She's been taking Tylenol with some improvement. She denies numbness/tingling, weakness.   2) Chronic Back and Lower Extremity Pain: Chronic back pain. Her lower extremity pain is located to right posterior popliteal area that has been present for the past 6 months. She denies trauma, radiation of pain, weakness. She does experience numbness at night when laying.   3) Left Ear Pain: Located to left ear that has been present for several years. She uses q-tips infrequently, has noticed a slight reduction in hearting. She denies fevers, chills. She wears ear plugs nightly as her husband snores.                                                                                                                                                                                                 4) HRT: Currently managed on paroxetine CR 12.5 mg and premarin 0.3 mg. Overall she feels well managed. She is taking both medications daily and is needing refills. Denies SI/HI.  Review of Systems  Constitutional: Negative for fever.  HENT: Positive for ear pain. Negative for sore throat.   Respiratory: Negative for cough and shortness of breath.   Cardiovascular: Negative for chest pain.  Genitourinary:       Denies hot flashes  Musculoskeletal: Positive for arthralgias and back pain.       Right shoulder pain       Past Medical History:  Diagnosis Date  . Chicken pox   . History of UTI   . Hypertension   . Iron deficiency anemia        Social History   Socioeconomic History  . Marital status: Married    Spouse name: Not on file  . Number of children: Not on file  . Years of education: Not on file  . Highest education level: Not on file  Social Needs  . Financial resource strain: Not on file  . Food insecurity - worry: Not on file  . Food insecurity - inability: Not on file  . Transportation needs - medical: Not on file  . Transportation needs - non-medical: Not  on file  Occupational History  . Not on file  Tobacco Use  . Smoking status: Never Smoker  . Smokeless tobacco: Never Used  Substance and Sexual Activity  . Alcohol use: No    Alcohol/week: 0.0 oz  . Drug use: No  . Sexual activity: Not on file  Other Topics Concern  . Not on file  Social History Narrative   Married.   [place]oved from [place].   One child, one grandchild.   Home maker   Enjoys decorating, making jewelry.    Past Surgical History:  Procedure Laterality Date  . ABDOMINAL HYSTERECTOMY    . GASTRIC BYPASS    . TONSILLECTOMY AND ADENOIDECTOMY      Family History  Problem Relation Age of Onset  . Arthritis Mother   . Cancer Mother        Breast tumors  . Hyperlipidemia Mother   . Heart disease Mother   . Stroke Mother   . Hypertension Mother   . Hyperlipidemia Father   . Heart disease Father   . Stroke Father   . Arthritis Maternal Grandmother   . Cancer Maternal Grandmother        Breast  . Hyperlipidemia Maternal Grandmother   . Heart disease Maternal Grandmother   . Stroke Maternal Grandmother   . Hypertension Maternal Grandmother   . Hyperlipidemia Maternal Grandfather   . Hyperlipidemia Paternal Grandmother   . Hyperlipidemia Paternal Grandfather     Allergies  Allergen Reactions  . Codeine     Current Outpatient Medications on File Prior to Visit  Medication Sig Dispense Refill  . predniSONE (DELTASONE) 5 MG tablet Take 1 tablet (5 mg total) by mouth as directed. Standard sterapred 21 tablet 0   No  current facility-administered medications on file prior to visit.     BP 116/66   Pulse (!) 58   Temp 98.4 F (36.9 C) (Oral)   SpO2 96%    Objective:   Physical Exam  Constitutional: She appears well-nourished.  HENT:  Right Ear: Tympanic membrane and ear canal normal.  Left Ear: Tympanic membrane normal.  Nose: Right sinus exhibits no maxillary sinus tenderness and no frontal sinus tenderness. Left sinus exhibits no maxillary sinus tenderness and no frontal sinus tenderness.  Mouth/Throat: Oropharynx is clear and moist.  Left canal with cerumen impaction. TM unremarkable post irrigation.   Eyes: Conjunctivae are normal.  Neck: Neck supple.  Cardiovascular: Normal rate and regular rhythm.  Pulmonary/Chest: Effort normal and breath sounds normal. She has no wheezes. She has no rales.  Musculoskeletal:       Right shoulder: She exhibits decreased range of motion and pain. She exhibits no tenderness and no bony tenderness.  Overall normal ROM with slight decrease with posterior abduction. 5/5 strength. Muscle soreness to right lateral and poster neck.  Lymphadenopathy:    She has no cervical adenopathy.  Skin: Skin is warm and dry.          Assessment & Plan:  Ear Pain:  Located to left ear for years. Exam today with deep cerumen impaction to left canal, TM post irrigation unremarkable. Discussed to refrain from q-tip use. Discussed debrox drops, flonase.  Doreene NestKatherine K Terren Jandreau, NP

## 2017-05-02 NOTE — Patient Instructions (Signed)
Complete xray(s) prior to leaving today. I will notify you of your results once received.  Try taking Premarin every other day. Continue Paxil daily.  Try the shoulder and back exercises exercises below and schedule an appointment with Dr. Patsy Lager at your convenience.  Try using Flonase (fluticasone) nasal spray for the bumps in the nose. Instill 1 spray in each nostril twice daily.   It was a pleasure to see you today!   Back Exercises The following exercises strengthen the muscles that help to support the back. They also help to keep the lower back flexible. Doing these exercises can help to prevent back pain or lessen existing pain. If you have back pain or discomfort, try doing these exercises 2-3 times each day or as told by your health care provider. When the pain goes away, do them once each day, but increase the number of times that you repeat the steps for each exercise (do more repetitions). If you do not have back pain or discomfort, do these exercises once each day or as told by your health care provider. Exercises Single Knee to Chest  Repeat these steps 3-5 times for each leg: 1. Lie on your back on a firm bed or the floor with your legs extended. 2. Bring one knee to your chest. Your other leg should stay extended and in contact with the floor. 3. Hold your knee in place by grabbing your knee or thigh. 4. Pull on your knee until you feel a gentle stretch in your lower back. 5. Hold the stretch for 10-30 seconds. 6. Slowly release and straighten your leg.  Pelvic Tilt  Repeat these steps 5-10 times: 1. Lie on your back on a firm bed or the floor with your legs extended. 2. Bend your knees so they are pointing toward the ceiling and your feet are flat on the floor. 3. Tighten your lower abdominal muscles to press your lower back against the floor. This motion will tilt your pelvis so your tailbone points up toward the ceiling instead of pointing to your feet or the  floor. 4. With gentle tension and even breathing, hold this position for 5-10 seconds.  Cat-Cow  Repeat these steps until your lower back becomes more flexible: 1. Get into a hands-and-knees position on a firm surface. Keep your hands under your shoulders, and keep your knees under your hips. You may place padding under your knees for comfort. 2. Let your head hang down, and point your tailbone toward the floor so your lower back becomes rounded like the back of a cat. 3. Hold this position for 5 seconds. 4. Slowly lift your head and point your tailbone up toward the ceiling so your back forms a sagging arch like the back of a cow. 5. Hold this position for 5 seconds.  Press-Ups  Repeat these steps 5-10 times: 1. Lie on your abdomen (face-down) on the floor. 2. Place your palms near your head, about shoulder-width apart. 3. While you keep your back as relaxed as possible and keep your hips on the floor, slowly straighten your arms to raise the top half of your body and lift your shoulders. Do not use your back muscles to raise your upper torso. You may adjust the placement of your hands to make yourself more comfortable. 4. Hold this position for 5 seconds while you keep your back relaxed. 5. Slowly return to lying flat on the floor.  Bridges  Repeat these steps 10 times: 1. Lie on your back  on a firm surface. 2. Bend your knees so they are pointing toward the ceiling and your feet are flat on the floor. 3. Tighten your buttocks muscles and lift your buttocks off of the floor until your waist is at almost the same height as your knees. You should feel the muscles working in your buttocks and the back of your thighs. If you do not feel these muscles, slide your feet 1-2 inches farther away from your buttocks. 4. Hold this position for 3-5 seconds. 5. Slowly lower your hips to the starting position, and allow your buttocks muscles to relax completely.  If this exercise is too easy, try  doing it with your arms crossed over your chest. Abdominal Crunches  Repeat these steps 5-10 times: 1. Lie on your back on a firm bed or the floor with your legs extended. 2. Bend your knees so they are pointing toward the ceiling and your feet are flat on the floor. 3. Cross your arms over your chest. 4. Tip your chin slightly toward your chest without bending your neck. 5. Tighten your abdominal muscles and slowly raise your trunk (torso) high enough to lift your shoulder blades a tiny bit off of the floor. Avoid raising your torso higher than that, because it can put too much stress on your low back and it does not help to strengthen your abdominal muscles. 6. Slowly return to your starting position.  Back Lifts Repeat these steps 5-10 times: 1. Lie on your abdomen (face-down) with your arms at your sides, and rest your forehead on the floor. 2. Tighten the muscles in your legs and your buttocks. 3. Slowly lift your chest off of the floor while you keep your hips pressed to the floor. Keep the back of your head in line with the curve in your back. Your eyes should be looking at the floor. 4. Hold this position for 3-5 seconds. 5. Slowly return to your starting position.  Contact a health care provider if:  Your back pain or discomfort gets much worse when you do an exercise.  Your back pain or discomfort does not lessen within 2 hours after you exercise. If you have any of these problems, stop doing these exercises right away. Do not do them again unless your health care provider says that you can. Get help right away if:  You develop sudden, severe back pain. If this happens, stop doing the exercises right away. Do not do them again unless your health care provider says that you can. This information is not intended to replace advice given to you by your health care provider. Make sure you discuss any questions you have with your health care provider. Document Released: 04/18/2004  Document Revised: 07/19/2015 Document Reviewed: 05/05/2014 Elsevier Interactive Patient Education  2017 Else[employer] Inc[employer]    Shoulder Range of Motion Exercises Shoulder range of motion (ROM) exercises are designed to keep the shoulder moving freely. They are often recommended for people who have shoulder pain. Phase 1 exercises When you are able, do this exercise 5-6 days per week, or as told by your health care provider. Work toward doing 2 sets of 10 swings. Pendulum Exercise How To Do This Exercise Lying Down 1. Lie face-down on a bed with your abdomen close to the side of the bed. 2. Let your arm hang over the side of the bed. 3. Relax your shoulder, arm, and hand. 4. Slowly and gently swing your arm forward and back. Do not use your neck  muscles to swing your arm. They should be relaxed. If you are struggling to swing your arm, have someone gently swing it for you. When you do this exercise for the first time, swing your arm at a 15 degree angle for 15 seconds, or swing your arm 10 times. As pain lessens over time, increase the angle of the swing to 30-45 degrees. 5. Repeat steps 1-4 with the other arm.  How To Do This Exercise While Standing 1. Stand next to a sturdy chair or table and hold on to it with your hand. 1. Bend forward at the waist. 2. Bend your knees slightly. 3. Relax your other arm and let it hang limp. 4. Relax the shoulder blade of the arm that is hanging and let it drop. 5. While keeping your shoulder relaxed, use body motion to swing your arm in small circles. The first time you do this exercise, swing your arm for about 30 seconds or 10 times. When you do it next time, swing your arm for a little longer. 6. Stand up tall and relax. 7. Repeat steps 1-7, this time changing the direction of the circles. 2. Repeat steps 1-8 with the other arm.  Phase 2 exercises Do these exercises 3-4 times per day on 5-6 days per week or as told by your health care provider. Work  toward holding the stretch for 20 seconds. Stretching Exercise 1 1. Lift your arm straight out in front of you. 2. Bend your arm 90 degrees at the elbow (right angle) so your forearm goes across your body and looks like the letter "L." 3. Use your other arm to gently pull the elbow forward and across your body. 4. Repeat steps 1-3 with the other arm. Stretching Exercise 2 You will need a towel or rope for this exercise. 1. Bend one arm behind your back with the palm facing outward. 2. Hold a towel with your other hand. 3. Reach the arm that holds the towel above your head, and bend that arm at the elbow. Your wrist should be behind your neck. 4. Use your free hand to grab the free end of the towel. 5. With the higher hand, gently pull the towel up behind you. 6. With the lower hand, pull the towel down behind you. 7. Repeat steps 1-6 with the other arm.  Phase 3 exercises Do each of these exercises at four different times of day (sessions) every day or as told by your health care provider. To begin with, repeat each exercise 5 times (repetitions). Work toward doing 3 sets of 12 repetitions or as told by your health care provider. Strengthening Exercise 1 You will need a light weight for this activity. As you grow stronger, you may use a heavier weight. 1. Standing with a weight in your hand, lift your arm straight out to the side until it is at the same height as your shoulder. 2. Bend your arm at 90 degrees so that your fingers are pointing to the ceiling. 3. Slowly raise your hand until your arm is straight up in the air. 4. Repeat steps 1-3 with the other arm.  Strengthening Exercise 2 You will need a light weight for this activity. As you grow stronger, you may use a heavier weight. 1. Standing with a weight in your hand, gradually move your straight arm in an arc, starting at your side, then out in front of you, then straight up over your head. 2. Gradually move your other arm in an  arc, starting at your side, then out in front of you, then straight up over your head. 3. Repeat steps 1-2 with the other arm.  Strengthening Exercise 3 You will need an elastic band for this activity. As you grow stronger, gradually increase the size of the bands or increase the number of bands that you use at one time. 1. While standing, hold an elastic band in one hand and raise that arm up in the air. 2. With your other hand, pull down the band until that hand is by your side. 3. Repeat steps 1-2 with the other arm.  This information is not intended to replace advice given to you by your health care provider. Make sure you discuss any questions you have with your health care provider. Document Released: 12/08/2002 Document Revised: 11/05/2015 Document Reviewed: 03/07/2014 Elsevier Interactive Patient Education  [employer].

## 2017-05-14 ENCOUNTER — Telehealth: Payer: BLUE CROSS/BLUE SHIELD | Admitting: Family

## 2017-05-14 DIAGNOSIS — J019 Acute sinusitis, unspecified: Secondary | ICD-10-CM

## 2017-05-14 MED ORDER — AMOXICILLIN-POT CLAVULANATE 875-125 MG PO TABS: 1.0000 | ORAL_TABLET | Freq: Two times a day (BID) | ORAL | 0 refills | Status: DC

## 2017-05-14 NOTE — Progress Notes (Signed)

## 2017-07-07 ENCOUNTER — Encounter: Payer: Self-pay | Admitting: Primary Care

## 2017-07-07 DIAGNOSIS — E669 Obesity, unspecified: Secondary | ICD-10-CM

## 2017-07-07 DIAGNOSIS — Z79890 Hormone replacement therapy (postmenopausal): Secondary | ICD-10-CM

## 2017-07-10 ENCOUNTER — Other Ambulatory Visit (INDEPENDENT_AMBULATORY_CARE_PROVIDER_SITE_OTHER): Payer: BLUE CROSS/BLUE SHIELD

## 2017-07-10 DIAGNOSIS — E669 Obesity, unspecified: Secondary | ICD-10-CM | POA: Diagnosis not present

## 2017-07-10 DIAGNOSIS — Z79890 Hormone replacement therapy (postmenopausal): Secondary | ICD-10-CM

## 2017-07-10 LAB — COMPREHENSIVE METABOLIC PANEL
ALT: 24 U/L (ref 0–35)
AST: 24 U/L (ref 0–37)
Albumin: 4 g/dL (ref 3.5–5.2)
Alkaline Phosphatase: 56 U/L (ref 39–117)
BILIRUBIN TOTAL: 0.3 mg/dL (ref 0.2–1.2)
BUN: 18 mg/dL (ref 6–23)
CALCIUM: 9.6 mg/dL (ref 8.4–10.5)
CHLORIDE: 104 meq/L (ref 96–112)
CO2: 29 meq/L (ref 19–32)
Creatinine, Ser: 0.79 mg/dL (ref 0.40–1.20)
GFR: 79.34 mL/min (ref >60.00)
Glucose, Bld: 94 mg/dL (ref 70–99)
Potassium: 4.7 mEq/L (ref 3.5–5.1)
Sodium: 140 mEq/L (ref 135–145)
Total Protein: 6.8 g/dL (ref 6.0–8.3)

## 2017-07-10 LAB — TSH: TSH: 1.3 u[IU]/mL (ref 0.35–4.50)

## 2017-07-10 LAB — LIPID PANEL
CHOL/HDL RATIO: 2
Cholesterol: 178 mg/dL (ref 0–200)
HDL: 76 mg/dL (ref >39.00)
LDL Cholesterol: 92 mg/dL (ref 0–99)
NonHDL: 102.32
TRIGLYCERIDES: 52 mg/dL (ref 0.0–149.0)
VLDL: 10.4 mg/dL (ref 0.0–40.0)

## 2017-07-10 LAB — HEMOGLOBIN A1C: HEMOGLOBIN A1C: 5.8 % (ref 4.6–6.5)

## 2017-07-11 ENCOUNTER — Other Ambulatory Visit: Payer: Self-pay | Admitting: Primary Care

## 2017-07-11 ENCOUNTER — Encounter: Payer: Self-pay | Admitting: Primary Care

## 2017-07-11 DIAGNOSIS — R232 Flushing: Secondary | ICD-10-CM

## 2017-07-11 MED ORDER — ESTROGENS CONJUGATED 0.3 MG PO TABS: 0.3000 mg | ORAL_TABLET | Freq: Every day | ORAL | 1 refills | Status: DC

## 2017-07-15 ENCOUNTER — Encounter: Payer: Self-pay | Admitting: Primary Care

## 2017-07-15 ENCOUNTER — Other Ambulatory Visit: Payer: Self-pay | Admitting: Primary Care

## 2017-07-15 DIAGNOSIS — R232 Flushing: Secondary | ICD-10-CM

## 2017-07-15 MED ORDER — ESTROGENS CONJUGATED 0.3 MG PO TABS: 0.3000 mg | ORAL_TABLET | Freq: Every day | ORAL | 1 refills | Status: DC

## 2017-07-15 NOTE — Telephone Encounter (Signed)
There is a MyChart message regarding this Rx request.

## 2017-09-08 ENCOUNTER — Ambulatory Visit (INDEPENDENT_AMBULATORY_CARE_PROVIDER_SITE_OTHER)
Admission: RE | Admit: 2017-09-08 | Discharge: 2017-09-08 | Disposition: A | Discharge status: Home / Self Care | Payer: BLUE CROSS/BLUE SHIELD | Source: Ambulatory Visit | Attending: Primary Care | Admitting: Primary Care

## 2017-09-08 ENCOUNTER — Encounter: Payer: Self-pay | Admitting: Primary Care

## 2017-09-08 ENCOUNTER — Ambulatory Visit (INDEPENDENT_AMBULATORY_CARE_PROVIDER_SITE_OTHER): Payer: BLUE CROSS/BLUE SHIELD | Admitting: Primary Care

## 2017-09-08 VITALS — BP 104/64 | HR 62 | Temp 98.2°F | Ht 68.0 in

## 2017-09-08 DIAGNOSIS — G8929 Other chronic pain: Secondary | ICD-10-CM | POA: Diagnosis not present

## 2017-09-08 DIAGNOSIS — M25561 Pain in right knee: Secondary | ICD-10-CM | POA: Diagnosis not present

## 2017-09-08 MED ORDER — GABAPENTIN 100 MG PO CAPS: 100.0000 mg | ORAL_CAPSULE | Freq: Every day | ORAL | 0 refills | Status: DC

## 2017-09-08 NOTE — Patient Instructions (Signed)
Try gabapentin 100 mg capsules every night at bedtime for knee/leg pain.   Complete xray(s) prior to leaving today. I will notify you of your results once received.  Please update us in a few weeks.  It was a pleasure to see you today!

## 2017-09-08 NOTE — Assessment & Plan Note (Signed)
Right lateral posterior knee pain x 6 months. Exam today unremarkable. Differentials include arthritis, bakers cyst not visible or palpable on exam, restless leg syndrome. Do not suspect DVT given lack of cardinal symptoms and normal exam.  Will start with plain films of the knee. Rx for low dose gabapentin sent to pharmacy. She will update in 2 weeks. Consider Sports medicine evaluation if no improvement.

## 2017-09-08 NOTE — Progress Notes (Signed)
Subjective:    Patient ID: Judy Price, female    DOB: 1960-05-09, 57 y.o.   MRN: [id number]  HPI  Judy Price is a 57 year old female who presents today with a chief complaint of knee pain.   Her pain is located to the right lateral posterior knee with intermittent radiation to her right mid calf. Most of the time her pain is localized. Her left lower extremity will occasionally feel numb so she'll dangle her left leg over the bed with improvement in her numbness.   Most of her pain occurs at night, rarely experiences pain throughout the day when at rest. She will have to move her legs around to get comfortable after in bed for 30 minutes. She's been taking Tylenol at bedtime with some improvement. Her pain will wake her from sleep nearly every night.   She denies swelling, redness, long plane/car travel, tobacco abuse, recent injury/trauma, skin masses.   Review of Systems  Cardiovascular: Negative for palpitations.  Musculoskeletal:       Right lateral posterior knee pain  Skin: Negative for color change and wound.  Neurological: Positive for numbness.       Past Medical History:  Diagnosis Date  . Chicken pox   . History of UTI   . Hypertension   . Iron deficiency anemia      Social History   Socioeconomic History  . Marital status: Married    Spouse name: Not on file  . Number of children: Not on file  . Years of education: Not on file  . Highest education level: Not on file  Occupational History  . Not on file  Social Needs  . Financial resource strain: Not on file  . Food insecurity:    Worry: Not on file    Inability: Not on file  . Transportation needs:    Medical: Not on file    Non-medical: Not on file  Tobacco Use  . Smoking status: Never Smoker  . Smokeless tobacco: Never Used  Substance and Sexual Activity  . Alcohol use: No    Alcohol/week: 0.0 oz  . Drug use: No  . Sexual activity: Not on file  Lifestyle  . Physical activity:    Days per  week: Not on file    Minutes per session: Not on file  . Stress: Not on file  Relationships  . Social connections:    Talks on phone: Not on file    Gets together: Not on file    Attends religious service: Not on file    Active member of club or organization: Not on file    Attends meetings of clubs or organizations: Not on file    Relationship status: Not on file  . Intimate partner violence:    Fear of current or ex partner: Not on file    Emotionally abused: Not on file    Physically abused: Not on file    Forced sexual activity: Not on file  Other Topics Concern  . Not on file  Social History Narrative   Married.   [place]   One child, one grandchild.   Home maker   Enjoys decorating, making jewelry.    Past Surgical History:  Procedure Laterality Date  . ABDOMINAL HYSTERECTOMY    . GASTRIC BYPASS    . TONSILLECTOMY AND ADENOIDECTOMY      Family History  Problem Relation Age of Onset  . Arthritis Mother   . Cancer Mother  Breast tumors  . Hyperlipidemia Mother   . Heart disease Mother   . Stroke Mother   . Hypertension Mother   . Hyperlipidemia Father   . Heart disease Father   . Stroke Father   . Arthritis Maternal Grandmother   . Cancer Maternal Grandmother        Breast  . Hyperlipidemia Maternal Grandmother   . Heart disease Maternal Grandmother   . Stroke Maternal Grandmother   . Hypertension Maternal Grandmother   . Hyperlipidemia Maternal Grandfather   . Hyperlipidemia Paternal Grandmother   . Hyperlipidemia Paternal Grandfather     Allergies  Allergen Reactions  . Codeine     Current Outpatient Medications on File Prior to Visit  Medication Sig Dispense Refill  . estrogens, conjugated, (PREMARIN) 0.3 MG tablet Take 1 tablet (0.3 mg total) by mouth daily. 90 tablet 1  . PARoxetine (PAXIL CR) 12.5 MG 24 hr tablet Take 1 tablet by mouth once daily for hot flashes. 90 tablet 1   No current facility-administered  medications on file prior to visit.     BP 104/64   Pulse 62   Temp 98.2 F (36.8 C) (Oral)   Ht 5\' 8"  (1.727 m)   SpO2 96%   BMI 26.91 kg/m    Objective:   Physical Exam  Cardiovascular: Normal rate and regular rhythm.  Respiratory: Effort normal.  Musculoskeletal:       Right knee: She exhibits normal range of motion, no erythema and no bony tenderness. No tenderness found.       Legs: Skin: Skin is warm and dry. No erythema.  No masses appreciated on exam           Assessment & Plan:

## 2017-09-30 ENCOUNTER — Other Ambulatory Visit: Payer: Self-pay | Admitting: Primary Care

## 2017-09-30 DIAGNOSIS — G8929 Other chronic pain: Secondary | ICD-10-CM

## 2017-09-30 DIAGNOSIS — M25561 Pain in right knee: Principal | ICD-10-CM

## 2017-09-30 DIAGNOSIS — G2581 Restless legs syndrome: Secondary | ICD-10-CM

## 2017-09-30 NOTE — Telephone Encounter (Signed)
Noted, will send patient my chart message for an update after treatment with gabapentin.

## 2017-09-30 NOTE — Telephone Encounter (Signed)
Electronically refill request for gabapentin (NEURONTIN) 100 MG capsule to 90 days supply  Last prescribed on 09/08/2017  Last office visit on 09/08/2017

## 2017-10-01 MED ORDER — GABAPENTIN 300 MG PO CAPS: 300.0000 mg | ORAL_CAPSULE | Freq: Every day | ORAL | 0 refills | Status: DC

## 2017-10-06 ENCOUNTER — Other Ambulatory Visit: Payer: Self-pay | Admitting: Primary Care

## 2017-10-06 DIAGNOSIS — R232 Flushing: Secondary | ICD-10-CM

## 2017-10-06 NOTE — Telephone Encounter (Signed)
Noted, refill sent to pharmacy. 

## 2017-10-06 NOTE — Telephone Encounter (Signed)
Electronically refill request for PARoxetine (PAXIL CR) 12.5 MG 24 hr tablet  Last prescribed on 05/02/2017  Last office visit on 09/08/2017

## 2017-10-26 ENCOUNTER — Other Ambulatory Visit: Payer: Self-pay | Admitting: Primary Care

## 2017-10-26 DIAGNOSIS — G2581 Restless legs syndrome: Secondary | ICD-10-CM

## 2017-10-27 DIAGNOSIS — G2581 Restless legs syndrome: Secondary | ICD-10-CM

## 2017-10-27 MED ORDER — GABAPENTIN 300 MG PO CAPS: 300.0000 mg | ORAL_CAPSULE | Freq: Every day | ORAL | 1 refills | Status: DC

## 2017-10-27 NOTE — Telephone Encounter (Signed)
Noted, will verify that this dose is working with patient. Will send my chart message.

## 2017-10-27 NOTE — Telephone Encounter (Signed)
Last prescribed on 10/01/2017  Last office visit on 09/08/2017

## 2017-10-28 NOTE — Telephone Encounter (Signed)
Patient doing well at this dose, refill sent to pharmacy.

## 2017-12-07 ENCOUNTER — Telehealth: Payer: BLUE CROSS/BLUE SHIELD | Admitting: Family

## 2017-12-07 DIAGNOSIS — B9689 Other specified bacterial agents as the cause of diseases classified elsewhere: Secondary | ICD-10-CM

## 2017-12-07 DIAGNOSIS — J028 Acute pharyngitis due to other specified organisms: Secondary | ICD-10-CM

## 2017-12-07 MED ORDER — PREDNISONE 5 MG PO TABS: 5.0000 mg | ORAL_TABLET | ORAL | 0 refills | Status: DC

## 2017-12-07 MED ORDER — BENZONATATE 100 MG PO CAPS: 100.0000 mg | ORAL_CAPSULE | Freq: Three times a day (TID) | ORAL | 0 refills | Status: DC | PRN

## 2017-12-07 MED ORDER — DOXYCYCLINE HYCLATE 100 MG PO TABS: 100.0000 mg | ORAL_TABLET | Freq: Two times a day (BID) | ORAL | 0 refills | Status: DC

## 2017-12-07 NOTE — Progress Notes (Signed)
Thank you for the details you included in the comment boxes. Those details are very helpful in determining the best course of treatment for you and help us to provide the best care.  We are sorry that you are not feeling well.  Here is how we plan to help!  Based on your presentation I believe you most likely have A cough due to bacteria.  When patients have a fever and a productive cough with a change in color or increased sputum production, we are concerned about bacterial bronchitis.  If left untreated it can progress to pneumonia.  If your symptoms do not improve with your treatment plan it is important that you contact your provider.   I have prescribed Doxycycline 100 mg twice a day for 7 days     In addition you may use A non-prescription cough medication called Mucinex DM: take 2 tablets every 12 hours. and A prescription cough medication called Tessalon Perles 100mg. You may take 1-2 capsules every 8 hours as needed for your cough.  Prednisone 5 mg daily for 6 days (see taper instructions below)  Directions for 6 day taper: Day 1: 2 tablets before breakfast, 1 after both lunch & dinner and 2 at bedtime Day 2: 1 tab before breakfast, 1 after both lunch & dinner and 2 at bedtime Day 3: 1 tab at each meal & 1 at bedtime Day 4: 1 tab at breakfast, 1 at lunch, 1 at bedtime Day 5: 1 tab at breakfast & 1 tab at bedtime Day 6: 1 tab at breakfast   From your responses in the eVisit questionnaire you describe inflammation in the upper respiratory tract which is causing a significant cough.  This is commonly called Bronchitis and has four common causes:    Allergies  Viral Infections  Acid Reflux  Bacterial Infection Allergies, viruses and acid reflux are treated by controlling symptoms or eliminating the cause. An example might be a cough caused by taking certain blood pressure medications. You stop the cough by changing the medication. Another example might be a cough caused by acid  reflux. Controlling the reflux helps control the cough.  USE OF BRONCHODILATOR ("RESCUE") INHALERS: There is a risk from using your bronchodilator too frequently.  The risk is that over-reliance on a medication which only relaxes the muscles surrounding the breathing tubes can reduce the effectiveness of medications prescribed to reduce swelling and congestion of the tubes themselves.  Although you feel brief relief from the bronchodilator inhaler, your asthma may actually be worsening with the tubes becoming more swollen and filled with mucus.  This can delay other crucial treatments, such as oral steroid medications. If you need to use a bronchodilator inhaler daily, several times per day, you should discuss this with your provider.  There are probably better treatments that could be used to keep your asthma under control.     HOME CARE . Only take medications as instructed by your medical team. . Complete the entire course of an antibiotic. . Drink plenty of fluids and get plenty of rest. . Avoid close contacts especially the very young and the elderly . Cover your mouth if you cough or cough into your sleeve. . Always remember to wash your hands . A steam or ultrasonic humidifier can help congestion.   GET HELP RIGHT AWAY IF: . You develop worsening fever. . You become short of breath . You cough up blood. . Your symptoms persist after you have completed your treatment plan MAKE   SURE YOU   Understand these instructions.  Will watch your condition.  Will get help right away if you are not doing well or get worse.  Your e-visit answers were reviewed by a board certified advanced clinical practitioner to complete your personal care plan.  Depending on the condition, your plan could have included both over the counter or prescription medications. If there is a problem please reply  once you have received a response from your provider. Your safety is important to us.  If you have drug  allergies check your prescription carefully.    You can use MyChart to ask questions about today's visit, request a non-urgent call back, or ask for a work or school excuse for 24 hours related to this e-Visit. If it has been greater than 24 hours you will need to follow up with your provider, or enter a new e-Visit to address those concerns. You will get an e-mail in the next two days asking about your experience.  I hope that your e-visit has been valuable and will speed your recovery. Thank you for using e-visits.   

## 2017-12-15 ENCOUNTER — Telehealth: Payer: BLUE CROSS/BLUE SHIELD | Admitting: Family

## 2017-12-15 DIAGNOSIS — J329 Chronic sinusitis, unspecified: Secondary | ICD-10-CM

## 2017-12-15 NOTE — Progress Notes (Signed)
Based on what you shared with me it looks like you have a serious condition that should be evaluated in a face to face office visit.  NOTE: If you entered your credit card information for this eVisit, you will not be charged. You may see a "hold" on your card for the $30 but that hold will drop off and you will not have a charge processed.  Since you just completed an  evisit on 9/15 and your symptoms are not better, I recommend that you follow up to a face-to-face visit with your primary care provider.  If you are having a true medical emergency please call 911.  If you need an urgent face to face visit,  has four urgent care centers for your convenience.  If you need care fast and have a high deductible or no insurance consider:   [email or web address] to reserve your spot online an avoid wait times  [facility] [address], [address] [place], [place] [address] 8 am to 8 pm Monday-Friday 10 am to 4 pm Saturday-Sunday *Across the street from [employer]  [facility]  [address] [place], [id number] 8 am to 5 pm Monday-Friday * In the [facility] on the [facility]   The following sites will take your  insurance:  . [facility]  [phone] Get Driving Directions Find a Provider at this Location  [address] [place], [place] [address] . 10 am to 8 pm Monday-Friday . 12 pm to 8 pm Saturday-Sunday   . [facility]  [phone] Get Driving Directions Find a Provider at this Location  [address], [address] [place], [place] [address] . 8 am to 8 pm Monday-Friday . 9 am to 6 pm Saturday . 11 am to 6 pm Sunday   . [facility] [place]  [phone] Get Driving Directions  [address] [place], [place] [address] . 8 am to 8 pm Monday-Friday . 8 am to 4 pm Saturday-Sunday   Your e-visit answers were reviewed by a  board certified advanced clinical practitioner to complete your personal care plan.  Thank you for using e-Visits.

## 2018-01-07 ENCOUNTER — Ambulatory Visit (INDEPENDENT_AMBULATORY_CARE_PROVIDER_SITE_OTHER): Payer: BLUE CROSS/BLUE SHIELD | Admitting: Family Medicine

## 2018-01-07 ENCOUNTER — Encounter: Payer: Self-pay | Admitting: Family Medicine

## 2018-01-07 VITALS — BP 108/68 | HR 52 | Temp 98.3°F | Ht 68.0 in

## 2018-01-07 DIAGNOSIS — L309 Dermatitis, unspecified: Secondary | ICD-10-CM | POA: Diagnosis not present

## 2018-01-07 MED ORDER — TRIAMCINOLONE ACETONIDE 0.1 % EX CREA: 1.0000 "application " | TOPICAL_CREAM | Freq: Two times a day (BID) | CUTANEOUS | 0 refills | Status: DC

## 2018-01-07 NOTE — Progress Notes (Signed)
   Subjective:    Patient ID: Judy Price, female    DOB: Jul 16, 1960, 57 y.o.   MRN: [id number]  HPI This is a 57 yo female who presents today with a tick bite that won't heal. She first noticed it 3 months ago. She thinks it was attached for 2 weeks. Was removed by her son. Since then, she had a course of doxy for URI. Denies headaches, myalgias, rash other than localized to site. No fever. Has been applying Neosporin and anti itch cream with little relief.   Past Medical History:  Diagnosis Date  . Chicken pox   . History of UTI   . Hypertension   . Iron deficiency anemia    Past Surgical History:  Procedure Laterality Date  . ABDOMINAL HYSTERECTOMY    . GASTRIC BYPASS    . TONSILLECTOMY AND ADENOIDECTOMY     Family History  Problem Relation Age of Onset  . Arthritis Mother   . Cancer Mother        Breast tumors  . Hyperlipidemia Mother   . Heart disease Mother   . Stroke Mother   . Hypertension Mother   . Hyperlipidemia Father   . Heart disease Father   . Stroke Father   . Arthritis Maternal Grandmother   . Cancer Maternal Grandmother        Breast  . Hyperlipidemia Maternal Grandmother   . Heart disease Maternal Grandmother   . Stroke Maternal Grandmother   . Hypertension Maternal Grandmother   . Hyperlipidemia Maternal Grandfather   . Hyperlipidemia Paternal Grandmother   . Hyperlipidemia Paternal Grandfather    Social History   Tobacco Use  . Smoking status: Never Smoker  . Smokeless tobacco: Never Used  Substance Use Topics  . Alcohol use: No    Alcohol/week: 0.0 standard drinks  . Drug use: No      Review of Systems Per HPI    Objective:   Physical Exam  Constitutional: She appears well-developed and well-nourished.  HENT:  Head: Normocephalic and atraumatic.  Eyes: Conjunctivae are normal.  Cardiovascular: Normal rate.  Pulmonary/Chest: Effort normal.  Skin: Skin is warm and dry.  Left lower abdomen with approx 1.5 cm raised area erythema,  slightly firm, small area excoriation on top (? From scratching). No drainage or fluctuance. No visible retained material on magnification.   Psychiatric: She has a normal mood and affect. Her behavior is normal. Judgment and thought content normal.  Vitals reviewed.        BP 108/68 (BP Location: Left Arm, Patient Position: Sitting, Cuff Size: Large)   Pulse (!) 52   Temp 98.3 F (36.8 C) (Oral)   Ht 5\' 8"  (1.727 m)   SpO2 95%   BMI 26.91 kg/m   Assessment & Plan:  1. Dermatitis - she was instructed to stop Neosporin, will try course of topical steroid cream - RTC precautions reviewed - triamcinolone cream (KENALOG) 0.1 %; Apply 1 application topically 2 (two) times daily.  Dispense: 30 g; Refill: 0   Olean Ree, FNP-BC  [facility], [facility]  01/07/2018 5:45 PM

## 2018-01-07 NOTE — Patient Instructions (Signed)
Use prescription cream twice a day for up to 10 days, if not better, please let me know

## 2018-01-14 ENCOUNTER — Ambulatory Visit: Payer: BLUE CROSS/BLUE SHIELD | Admitting: Primary Care

## 2018-02-17 ENCOUNTER — Telehealth: Payer: BLUE CROSS/BLUE SHIELD | Admitting: Nurse Practitioner

## 2018-02-17 DIAGNOSIS — J0100 Acute maxillary sinusitis, unspecified: Secondary | ICD-10-CM

## 2018-02-17 MED ORDER — AMOXICILLIN-POT CLAVULANATE 875-125 MG PO TABS: 1.0000 | ORAL_TABLET | Freq: Two times a day (BID) | ORAL | 0 refills | Status: DC

## 2018-02-17 NOTE — Progress Notes (Signed)

## 2018-03-19 ENCOUNTER — Ambulatory Visit (INDEPENDENT_AMBULATORY_CARE_PROVIDER_SITE_OTHER): Payer: BLUE CROSS/BLUE SHIELD | Admitting: Family Medicine

## 2018-03-19 ENCOUNTER — Encounter: Payer: Self-pay | Admitting: Family Medicine

## 2018-03-19 DIAGNOSIS — B029 Zoster without complications: Secondary | ICD-10-CM

## 2018-03-19 HISTORY — DX: Zoster without complications: B02.9

## 2018-03-19 MED ORDER — VALACYCLOVIR HCL 1 G PO TABS: 1000.0000 mg | ORAL_TABLET | Freq: Three times a day (TID) | ORAL | 0 refills | Status: DC

## 2018-03-19 MED ORDER — TRAMADOL HCL 50 MG PO TABS: 50.0000 mg | ORAL_TABLET | Freq: Three times a day (TID) | ORAL | 0 refills | Status: DC | PRN

## 2018-03-19 NOTE — Patient Instructions (Signed)
Take valtrex as directed  Try tramadol for pain with caution of sedation (also this will constipate so take a stool softener or miralax over the counter)   Drink lots of fluids   Update if not starting to improve in a week or if worsening

## 2018-03-19 NOTE — Assessment & Plan Note (Signed)
With rash in L5-S1 distribution (buttock) L and corresponding pain down leg  Px valtrex 1 g tid for 7d Tramadol for pain (caution of sedation as well as constipation)  Aware of poss interaction with paroxetine She will alert us if any problems  Handout given May consider vaccination in the future

## 2018-03-19 NOTE — Progress Notes (Signed)
Subjective:    Patient ID: Judy Price, female    DOB: 09-07-1960, 57 y.o.   MRN: [id number]  HPI Here for leg pain and rash   (? Shingles)   Some constipation Used a suppository At least 2 wk No diet changes  Drinks a lot of water  Refused weight today   Pain in leg 1 wk  Started behind L knee  Then back/buttock pain  Some n/t in her L foot   Rash - L buttock /burning  Has not had shingles vaccine   Has had shingles before in trunk area      Review of Systems  Constitutional: Positive for fatigue. Negative for activity change, appetite change, fever and unexpected weight change.  HENT: Negative for congestion, ear pain, rhinorrhea, sinus pressure and sore throat.   Eyes: Negative for pain, redness and visual disturbance.  Respiratory: Negative for cough, shortness of breath and wheezing.   Cardiovascular: Negative for chest pain and palpitations.  Gastrointestinal: Positive for constipation. Negative for abdominal pain, anal bleeding, blood in stool and diarrhea.  Endocrine: Negative for polydipsia and polyuria.  Genitourinary: Negative for dysuria, frequency and urgency.  Musculoskeletal: Negative for arthralgias, back pain and myalgias.       Pain in L low back and leg  Skin: Positive for rash. Negative for pallor.  Allergic/Immunologic: Negative for environmental allergies.  Neurological: Negative for dizziness, syncope and headaches.  Hematological: Negative for adenopathy. Does not bruise/bleed easily.  Psychiatric/Behavioral: Negative for decreased concentration and dysphoric mood. The patient is not nervous/anxious.        Objective:   Physical Exam Constitutional:      Appearance: Normal appearance.     Comments: Well appearing   Eyes:     General: No scleral icterus.    Extraocular Movements: Extraocular movements intact.     Conjunctiva/sclera: Conjunctivae normal.     Pupils: Pupils are equal, round, and reactive to light.  Neck:   Musculoskeletal: Normal range of motion.  Cardiovascular:     Rate and Rhythm: Normal rate and regular rhythm.  Pulmonary:     Effort: Pulmonary effort is normal.     Breath sounds: Normal breath sounds. No wheezing or rales.  Abdominal:     General: Abdomen is flat.     Palpations: Abdomen is soft.  Musculoskeletal:        General: No swelling.     Right lower leg: No edema.     Left lower leg: No edema.  Lymphadenopathy:     Cervical: No cervical adenopathy.  Skin:    General: Skin is warm and dry.     Capillary Refill: Capillary refill takes less than 2 seconds.     Findings: Rash present.     Comments: Rash on L buttock- erythematous cluster of vesicles (zoster)    Neurological:     General: No focal deficit present.     Mental Status: She is alert.  Psychiatric:        Mood and Affect: Mood normal.           Assessment & Plan:   Problem List Items Addressed This Visit      Other   Shingles    With rash in L5-S1 distribution (buttock) L and corresponding pain down leg  Px valtrex 1 g tid for 7d Tramadol for pain (caution of sedation as well as constipation)  Aware of poss interaction with paroxetine She will alert us if any problems  Handout given May  consider vaccination in the future       Relevant Medications   valACYclovir (VALTREX) 1000 MG tablet

## 2018-03-27 ENCOUNTER — Ambulatory Visit: Payer: BLUE CROSS/BLUE SHIELD | Admitting: Family Medicine

## 2018-04-27 DIAGNOSIS — G2581 Restless legs syndrome: Secondary | ICD-10-CM

## 2018-04-27 DIAGNOSIS — R232 Flushing: Secondary | ICD-10-CM

## 2018-04-27 MED ORDER — GABAPENTIN 300 MG PO CAPS: 300.0000 mg | ORAL_CAPSULE | Freq: Every day | ORAL | 0 refills | Status: DC

## 2018-04-27 MED ORDER — PAROXETINE HCL ER 12.5 MG PO TB24: ORAL_TABLET | ORAL | 0 refills | Status: DC

## 2018-04-27 NOTE — Telephone Encounter (Signed)
Judy Price, she'll need vaccinations around June-July 2020. FYI.

## 2018-04-29 DIAGNOSIS — G2581 Restless legs syndrome: Secondary | ICD-10-CM

## 2018-04-29 DIAGNOSIS — R232 Flushing: Secondary | ICD-10-CM

## 2018-04-29 MED ORDER — GABAPENTIN 300 MG PO CAPS: 300.0000 mg | ORAL_CAPSULE | Freq: Every day | ORAL | 0 refills | Status: DC

## 2018-04-29 MED ORDER — PAROXETINE HCL ER 12.5 MG PO TB24: ORAL_TABLET | ORAL | 0 refills | Status: DC

## 2018-05-06 DIAGNOSIS — Z20828 Contact with and (suspected) exposure to other viral communicable diseases: Secondary | ICD-10-CM

## 2018-05-06 MED ORDER — OSELTAMIVIR PHOSPHATE 75 MG PO CAPS: 75.0000 mg | ORAL_CAPSULE | Freq: Every day | ORAL | 0 refills | Status: DC

## 2018-05-10 ENCOUNTER — Telehealth: Payer: BLUE CROSS/BLUE SHIELD | Admitting: Family

## 2018-05-10 DIAGNOSIS — J019 Acute sinusitis, unspecified: Secondary | ICD-10-CM

## 2018-05-10 MED ORDER — AMOXICILLIN-POT CLAVULANATE 875-125 MG PO TABS: 1.0000 | ORAL_TABLET | Freq: Two times a day (BID) | ORAL | 0 refills | Status: DC

## 2018-05-10 NOTE — Progress Notes (Signed)
We are sorry that you are not feeling well.  Here is how we plan to help!  Based on what you have shared with me it looks like you have sinusitis.  Sinusitis is inflammation and infection in the sinus cavities of the head.  Based on your presentation I believe you most likely have Acute Bacterial Sinusitis.  This is an infection caused by bacteria and is treated with antibiotics. I have prescribed Augmentin 875mg /125mg  one tablet twice daily with food, for 7 days. You may use an oral decongestant such as Mucinex D or if you have glaucoma or high blood pressure use plain Mucinex. Saline nasal spray help and can safely be used as often as needed for congestion.  If you develop worsening sinus pain, fever or notice severe headache and vision changes, or if symptoms are not better after completion of antibiotic, please schedule an appointment with a health care provider.    Approximately five minutes was spent reviewing patient's chart and documenting.  Sinus infections are not as easily transmitted as other respiratory infection, however we still recommend that you avoid close contact with loved ones, especially the very young and elderly.  Remember to wash your hands thoroughly throughout the day as this is the number one way to prevent the spread of infection!  Home Care:  Only take medications as instructed by your medical team.  Complete the entire course of an antibiotic.  Do not take these medications with alcohol.  A steam or ultrasonic humidifier can help congestion.  You can place a towel over your head and breathe in the steam from hot water coming from a faucet.  Avoid close contacts especially the very young and the elderly.  Cover your mouth when you cough or sneeze.  Always remember to wash your hands.  Get Help Right Away If:  You develop worsening fever or sinus pain.  You develop a severe head ache or visual changes.  Your symptoms persist after you have completed your  treatment plan.  Make sure you  Understand these instructions.  Will watch your condition.  Will get help right away if you are not doing well or get worse.  Your e-visit answers were reviewed by a board certified advanced clinical practitioner to complete your personal care plan.  Depending on the condition, your plan could have included both over the counter or prescription medications.  If there is a problem please reply  once you have received a response from your provider.  Your safety is important to [place].  If you have drug allergies check your prescription carefully.    You can use MyChart to ask questions about today's visit, request a non-urgent call back, or ask for a work or school excuse for 24 hours related to this e-Visit. If it has been greater than 24 hours you will need to follow up with your provider, or enter a new e-Visit to address those concerns.  You will get an e-mail in the next two days asking about your experience.  I hope that your e-visit has been valuable and will speed your recovery. Thank you for using e-visits.

## 2018-05-11 ENCOUNTER — Other Ambulatory Visit: Payer: Self-pay

## 2018-05-11 DIAGNOSIS — J019 Acute sinusitis, unspecified: Secondary | ICD-10-CM

## 2018-05-12 ENCOUNTER — Other Ambulatory Visit: Payer: Self-pay | Admitting: Primary Care

## 2018-05-12 DIAGNOSIS — G2581 Restless legs syndrome: Secondary | ICD-10-CM

## 2018-05-12 MED ORDER — AMOXICILLIN-POT CLAVULANATE 875-125 MG PO TABS: 1.0000 | ORAL_TABLET | Freq: Two times a day (BID) | ORAL | 0 refills | Status: DC

## 2018-05-12 NOTE — Telephone Encounter (Signed)
Noted.  I will mark on my list to call her in around the first of July to ensure adequate time has passed given recent shingles infection.

## 2018-05-12 NOTE — Addendum Note (Signed)
Addended by: Ofilia Neas on: 05/12/2018 08:45 AM   Modules accepted: Orders

## 2018-05-25 ENCOUNTER — Ambulatory Visit (INDEPENDENT_AMBULATORY_CARE_PROVIDER_SITE_OTHER): Payer: BLUE CROSS/BLUE SHIELD | Admitting: Primary Care

## 2018-05-25 ENCOUNTER — Encounter: Payer: Self-pay | Admitting: Primary Care

## 2018-05-25 VITALS — BP 120/76 | HR 51 | Temp 98.0°F

## 2018-05-25 DIAGNOSIS — R232 Flushing: Secondary | ICD-10-CM | POA: Diagnosis not present

## 2018-05-25 DIAGNOSIS — M25511 Pain in right shoulder: Secondary | ICD-10-CM

## 2018-05-25 DIAGNOSIS — M542 Cervicalgia: Secondary | ICD-10-CM

## 2018-05-25 DIAGNOSIS — R7303 Prediabetes: Secondary | ICD-10-CM | POA: Diagnosis not present

## 2018-05-25 DIAGNOSIS — M25561 Pain in right knee: Secondary | ICD-10-CM

## 2018-05-25 DIAGNOSIS — G8929 Other chronic pain: Secondary | ICD-10-CM

## 2018-05-25 LAB — COMPREHENSIVE METABOLIC PANEL
ALT: 24 U/L (ref 0–35)
AST: 19 U/L (ref 0–37)
Albumin: 3.9 g/dL (ref 3.5–5.2)
Alkaline Phosphatase: 54 U/L (ref 39–117)
BUN: 21 mg/dL (ref 6–23)
CO2: 30 mEq/L (ref 19–32)
CREATININE: 0.82 mg/dL (ref 0.40–1.20)
Calcium: 9.3 mg/dL (ref 8.4–10.5)
Chloride: 106 mEq/L (ref 96–112)
GFR: 71.29 mL/min (ref >60.00)
Glucose, Bld: 86 mg/dL (ref 70–99)
Potassium: 5.3 mEq/L — ABNORMAL HIGH (ref 3.5–5.1)
SODIUM: 140 meq/L (ref 135–145)
Total Bilirubin: 0.4 mg/dL (ref 0.2–1.2)
Total Protein: 6.4 g/dL (ref 6.0–8.3)

## 2018-05-25 LAB — LIPID PANEL
Cholesterol: 176 mg/dL (ref 0–200)
HDL: 79.5 mg/dL (ref >39.00)
LDL Cholesterol: 89 mg/dL (ref 0–99)
NonHDL: 96.47
Total CHOL/HDL Ratio: 2
Triglycerides: 36 mg/dL (ref 0.0–149.0)
VLDL: 7.2 mg/dL (ref 0.0–40.0)

## 2018-05-25 LAB — HEMOGLOBIN A1C: Hgb A1c MFr Bld: 5.9 % (ref 4.6–6.5)

## 2018-05-25 NOTE — Patient Instructions (Signed)
Stop by the lab prior to leaving today. I will notify you of your results once received.   Start exercising. You should be getting 150 minutes of moderate intensity exercise weekly.  It's important to improve your diet by reducing consumption of fast food, fried food, processed snack foods, sugary drinks. Increase consumption of fresh vegetables and fruits, whole grains, water.  Ensure you are drinking 64 ounces of water daily.  It was a pleasure to see you today!  

## 2018-05-25 NOTE — Progress Notes (Signed)
Subjective:    Patient ID: Judy Price, female    DOB: 07-26-1960, 58 y.o.   MRN: [id number]  HPI  Judy Price is a 58 year old female who presents today for follow up.  1) Chronic Arthralgias: Currently managed on gabapentin 300 mg for which she takes HS. Overall doing well.   2) Hot Flashes: Currently managed on paroxetine CR 12.5 mg daily. Came off of her Premarin about one month ago and is doing well overall. She denies SI/HI.   3) Prediabetes: A1C of 5.8 in April 2019. Symptoms of polydipsia for the last several weeks. Recently returned from vacation in [place]. Has checked her glucose with her husbands machine when she feels "bad" and has noticed readings in the 50's-60's.   Review of Systems  Respiratory: Negative for shortness of breath.   Cardiovascular: Negative for chest pain.  Genitourinary:       Denies hot flashes   Allergic/Immunologic: Positive for environmental allergies.  Neurological: Negative for dizziness.       Past Medical History:  Diagnosis Date  . Chicken pox   . History of UTI   . Hypertension   . Iron deficiency anemia      Social History   Socioeconomic History  . Marital status: Married    Spouse name: Not on file  . Number of children: Not on file  . Years of education: Not on file  . Highest education level: Not on file  Occupational History  . Not on file  Social Needs  . Financial resource strain: Not on file  . Food insecurity:    Worry: Not on file    Inability: Not on file  . Transportation needs:    Medical: Not on file    Non-medical: Not on file  Tobacco Use  . Smoking status: Never Smoker  . Smokeless tobacco: Never Used  Substance and Sexual Activity  . Alcohol use: No    Alcohol/week: 0.0 standard drinks  . Drug use: No  . Sexual activity: Not on file  Lifestyle  . Physical activity:    Days per week: Not on file    Minutes per session: Not on file  . Stress: Not on file  Relationships  . Social connections:      Talks on phone: Not on file    Gets together: Not on file    Attends religious service: Not on file    Active member of club or organization: Not on file    Attends meetings of clubs or organizations: Not on file    Relationship status: Not on file  . Intimate partner violence:    Fear of current or ex partner: Not on file    Emotionally abused: Not on file    Physically abused: Not on file    Forced sexual activity: Not on file  Other Topics Concern  . Not on file  Social History Narrative   Married.   Moved from [place].   One child, one grandchild.   Home maker   Enjoys decorating, making jewelry.    Past Surgical History:  Procedure Laterality Date  . ABDOMINAL HYSTERECTOMY    . GASTRIC BYPASS    . TONSILLECTOMY AND ADENOIDECTOMY      Family History  Problem Relation Age of Onset  . Arthritis Mother   . Cancer Mother        Breast tumors  . Hyperlipidemia Mother   . Heart disease Mother   . Stroke Mother   .  Hypertension Mother   . Hyperlipidemia Father   . Heart disease Father   . Stroke Father   . Arthritis Maternal Grandmother   . Cancer Maternal Grandmother        Breast  . Hyperlipidemia Maternal Grandmother   . Heart disease Maternal Grandmother   . Stroke Maternal Grandmother   . Hypertension Maternal Grandmother   . Hyperlipidemia Maternal Grandfather   . Hyperlipidemia Paternal Grandmother   . Hyperlipidemia Paternal Grandfather     Allergies  Allergen Reactions  . Codeine     Current Outpatient Medications on File Prior to Visit  Medication Sig Dispense Refill  . gabapentin (NEURONTIN) 300 MG capsule Take 1 capsule (300 mg total) by mouth at bedtime. 90 capsule 0  . PARoxetine (PAXIL-CR) 12.5 MG 24 hr tablet TAKE 1 TABLET DAILY FOR HOT FLASHES 90 tablet 0   No current facility-administered medications on file prior to visit.     BP 120/76   Pulse (!) 51   Temp 98 F (36.7 C) (Oral)   SpO2 98%    Objective:   Physical Exam   Constitutional: She appears well-nourished.  Neck: Neck supple.  Cardiovascular: Normal rate and regular rhythm.  Respiratory: Effort normal and breath sounds normal.  Skin: Skin is warm and dry.  Psychiatric: She has a normal mood and affect.           Assessment & Plan:

## 2018-05-25 NOTE — Assessment & Plan Note (Signed)
Doing well on gabapentin 300 mg HS, continue same.  

## 2018-05-25 NOTE — Assessment & Plan Note (Signed)
Noted on labs from April 2019, repeat A1C pending. Also add lipids and CMP.

## 2018-05-25 NOTE — Assessment & Plan Note (Signed)
Off of premarin for the last one month, overall doing well. Continue paroxetine CR 12.5 mg daily.

## 2018-06-18 ENCOUNTER — Telehealth: Payer: BLUE CROSS/BLUE SHIELD | Admitting: Family

## 2018-06-18 DIAGNOSIS — H109 Unspecified conjunctivitis: Secondary | ICD-10-CM

## 2018-06-18 MED ORDER — POLYMYXIN B-TRIMETHOPRIM 10000-0.1 UNIT/ML-% OP SOLN: 1.0000 [drp] | OPHTHALMIC | 0 refills | Status: DC

## 2018-06-18 NOTE — Progress Notes (Signed)

## 2018-06-24 ENCOUNTER — Ambulatory Visit (INDEPENDENT_AMBULATORY_CARE_PROVIDER_SITE_OTHER): Payer: BLUE CROSS/BLUE SHIELD | Admitting: Primary Care

## 2018-06-24 ENCOUNTER — Other Ambulatory Visit: Payer: Self-pay

## 2018-06-24 ENCOUNTER — Encounter: Payer: Self-pay | Admitting: Primary Care

## 2018-06-24 DIAGNOSIS — J302 Other seasonal allergic rhinitis: Secondary | ICD-10-CM | POA: Diagnosis not present

## 2018-06-24 DIAGNOSIS — H1013 Acute atopic conjunctivitis, bilateral: Secondary | ICD-10-CM

## 2018-06-24 DIAGNOSIS — H1010 Acute atopic conjunctivitis, unspecified eye: Secondary | ICD-10-CM | POA: Insufficient documentation

## 2018-06-24 HISTORY — DX: Acute atopic conjunctivitis, unspecified eye: H10.10

## 2018-06-24 NOTE — Assessment & Plan Note (Signed)
Suspect symptoms to be largely from seasonal allergies. Discussed to stop Claritin, start Xyzal. She will update.

## 2018-06-24 NOTE — Progress Notes (Signed)
Subjective:    Patient ID: Judy Price, female    DOB: [birth year], 58 y.o.   MRN: [id number]  HPI  Virtual Visit via Video Note  I connected with Judy Price on 06/24/18 at 10:20 AM EDT by a video enabled telemedicine application and verified that I am speaking with the correct person using two identifiers.   I discussed the limitations of evaluation and management by telemedicine and the availability of in person appointments. The patient expressed understanding and agreed to proceed. She is at home, I am in the office.  History of Present Illness:  [name] is a 58 year old female who presents today via video with a chief complaint of eye irritation.  She also reports sore throat, ear pain, post nasal drip, some blurry vision after rubbing her eyes. She sent a message via my chart on 06/15/18 with reports of itchy/watery eyes. It was recommended that she try allergy eye drops first and then follow up if no improvement. She then completed an e-visit on 06/18/18, diagnosed with bacterial conjunctivitis and treated with Polytrim Ophthalmic drops.   Since her e-visit she's not noticed any improvement with the antibiotic eye drops. Her eyes are crusty in the morning when waking with a yellowish color, not matted shut. She's using Flonase nasal spray, Claritin, Benadryl, and allergy eye drops without much improvement. She denies fevers, cough, sinus pressure.    Observations/Objective:  Eyes appear puffy. Conjunctiva mildly injected. No cough, doesn't appear sickly/ill.   Assessment and Plan:  Suspect either viral/allergy conjunctivitis based off of HPI and presentation, especially given that she's had no improvement with antibiotic eye drops. Will have her switch from Claritin to Xyzal. Continue Flonase and allergy eye drops. She will update in 3-4 days, consider prescription allergy eye drops.  Follow Up Instructions:  Stop Claritin. Start Xyzal for allergies.  Continue  Flonase. Please update me Monday next week and if no improvement then we can try a prescription strength allergy eye drop.  It was nice to see you! Mayra Reel, NP-C    I discussed the assessment and treatment plan with the patient. The patient was provided an opportunity to ask questions and all were answered. The patient agreed with the plan and demonstrated an understanding of the instructions.   The patient was advised to call back or seek an in-person evaluation if the symptoms worsen or if the condition fails to improve as anticipated.    Doreene Nest, NP    Review of Systems  Constitutional: Negative for chills and fever.  HENT: Positive for ear pain, postnasal drip and sore throat. Negative for sinus pressure.   Eyes: Positive for itching. Negative for visual disturbance.       Past Medical History:  Diagnosis Date  . Chicken pox   . History of UTI   . Hypertension   . Iron deficiency anemia   . Shingles 03/19/2018     Social History   Socioeconomic History  . Marital status: Married    Spouse name: Not on file  . Number of children: Not on file  . Years of education: Not on file  . Highest education level: Not on file  Occupational History  . Not on file  Social Needs  . Financial resource strain: Not on file  . Food insecurity:    Worry: Not on file    Inability: Not on file  . Transportation needs:    Medical: Not on file    Non-medical: Not  on file  Tobacco Use  . Smoking status: Never Smoker  . Smokeless tobacco: Never Used  Substance and Sexual Activity  . Alcohol use: No    Alcohol/week: 0.0 standard drinks  . Drug use: No  . Sexual activity: Not on file  Lifestyle  . Physical activity:    Days per week: Not on file    Minutes per session: Not on file  . Stress: Not on file  Relationships  . Social connections:    Talks on phone: Not on file    Gets together: Not on file    Attends religious service: Not on file    Active member  of club or organization: Not on file    Attends meetings of clubs or organizations: Not on file    Relationship status: Not on file  . Intimate partner violence:    Fear of current or ex partner: Not on file    Emotionally abused: Not on file    Physically abused: Not on file    Forced sexual activity: Not on file  Other Topics Concern  . Not on file  Social History Narrative   Married.   Moved from [place].   One child, one grandchild.   Home maker   Enjoys decorating, making jewelry.    Past Surgical History:  Procedure Laterality Date  . ABDOMINAL HYSTERECTOMY    . GASTRIC BYPASS    . TONSILLECTOMY AND ADENOIDECTOMY      Family History  Problem Relation Age of Onset  . Arthritis Mother   . Cancer Mother        Breast tumors  . Hyperlipidemia Mother   . Heart disease Mother   . Stroke Mother   . Hypertension Mother   . Hyperlipidemia Father   . Heart disease Father   . Stroke Father   . Arthritis Maternal Grandmother   . Cancer Maternal Grandmother        Breast  . Hyperlipidemia Maternal Grandmother   . Heart disease Maternal Grandmother   . Stroke Maternal Grandmother   . Hypertension Maternal Grandmother   . Hyperlipidemia Maternal Grandfather   . Hyperlipidemia Paternal Grandmother   . Hyperlipidemia Paternal Grandfather     Allergies  Allergen Reactions  . Codeine     Current Outpatient Medications on File Prior to Visit  Medication Sig Dispense Refill  . gabapentin (NEURONTIN) 300 MG capsule Take 1 capsule (300 mg total) by mouth at bedtime. 90 capsule 0  . PARoxetine (PAXIL-CR) 12.5 MG 24 hr tablet TAKE 1 TABLET DAILY FOR HOT FLASHES 90 tablet 0  . trimethoprim-polymyxin b (POLYTRIM) ophthalmic solution Place 1 drop into both eyes every 4 (four) hours. 10 mL 0   No current facility-administered medications on file prior to visit.     There were no vitals taken for this visit.   Objective:   Physical Exam  Constitutional: She is oriented  to person, place, and time. She appears well-nourished.  Eyes:  Eyes appear puffy, mild injection to conjunctiva bilaterally   Respiratory: Effort normal.  Neurological: She is alert and oriented to person, place, and time.  Skin:  No facial rashes noted  Psychiatric: She has a normal mood and affect.           Assessment & Plan:

## 2018-06-24 NOTE — Assessment & Plan Note (Signed)
Suspect either viral/allergy conjunctivitis based off of HPI and presentation, especially given that she's had no improvement with antibiotic eye drops. Will have her switch from Claritin to Xyzal. Continue Flonase and allergy eye drops. She will update in 3-4 days, consider prescription allergy eye drops.

## 2018-06-24 NOTE — Patient Instructions (Signed)
Stop Claritin. Start Xyzal for allergies.  Continue Flonase. Please update me Monday next week and if no improvement then we can try a prescription strength allergy eye drop.  It was nice to see you! Mayra Reel, NP-C

## 2018-06-26 DIAGNOSIS — H1013 Acute atopic conjunctivitis, bilateral: Secondary | ICD-10-CM

## 2018-06-26 MED ORDER — OLOPATADINE HCL 0.1 % OP SOLN: 1.0000 [drp] | Freq: Two times a day (BID) | OPHTHALMIC | 0 refills | Status: DC

## 2018-07-30 ENCOUNTER — Other Ambulatory Visit: Payer: Self-pay

## 2018-07-30 DIAGNOSIS — R232 Flushing: Secondary | ICD-10-CM

## 2018-07-30 DIAGNOSIS — G2581 Restless legs syndrome: Secondary | ICD-10-CM

## 2018-07-31 MED ORDER — PAROXETINE HCL ER 12.5 MG PO TB24: ORAL_TABLET | ORAL | 0 refills | Status: DC

## 2018-07-31 MED ORDER — GABAPENTIN 300 MG PO CAPS: 300.0000 mg | ORAL_CAPSULE | Freq: Every day | ORAL | 0 refills | Status: DC

## 2018-09-21 ENCOUNTER — Telehealth: Payer: Self-pay | Admitting: Primary Care

## 2018-09-21 NOTE — Telephone Encounter (Signed)
Please call pt to set up 1st Shingrix vaccine. Tues 7/14, 7/21, 7/28, 8/4 and 8/11 are preferred. Slots are [id number],1200, 230, 245, 300, 315 and 330.  ° °

## 2018-10-02 ENCOUNTER — Ambulatory Visit: Payer: BC Managed Care – PPO | Admitting: Primary Care

## 2018-10-02 ENCOUNTER — Encounter: Payer: Self-pay | Admitting: Primary Care

## 2018-10-02 ENCOUNTER — Other Ambulatory Visit: Payer: Self-pay

## 2018-10-02 VITALS — BP 108/68 | HR 57 | Temp 97.8°F

## 2018-10-02 DIAGNOSIS — M25542 Pain in joints of left hand: Secondary | ICD-10-CM

## 2018-10-02 DIAGNOSIS — Z23 Encounter for immunization: Secondary | ICD-10-CM

## 2018-10-02 DIAGNOSIS — M25511 Pain in right shoulder: Secondary | ICD-10-CM | POA: Diagnosis not present

## 2018-10-02 DIAGNOSIS — M25541 Pain in joints of right hand: Secondary | ICD-10-CM | POA: Diagnosis not present

## 2018-10-02 DIAGNOSIS — G8929 Other chronic pain: Secondary | ICD-10-CM | POA: Diagnosis not present

## 2018-10-02 LAB — CBC
HCT: 34.8 % — ABNORMAL LOW (ref 36.0–46.0)
Hemoglobin: 11.2 g/dL — ABNORMAL LOW (ref 12.0–15.0)
MCHC: 32.2 g/dL (ref 30.0–36.0)
MCV: 79.4 fl (ref 78.0–100.0)
Platelets: 209 10*3/uL (ref 150.0–400.0)
RBC: 4.39 Mil/uL (ref 3.87–5.11)
RDW: 14.3 % (ref 11.5–15.5)
WBC: 3.5 10*3/uL — ABNORMAL LOW (ref 4.0–10.5)

## 2018-10-02 LAB — SEDIMENTATION RATE: Sed Rate: 8 mm/hr (ref 0–30)

## 2018-10-02 MED ORDER — PREDNISONE 10 MG PO TABS: ORAL_TABLET | ORAL | 0 refills | Status: DC

## 2018-10-02 NOTE — Assessment & Plan Note (Signed)
Noted and evidence on exam today.  Likely osteoarthritis but given family history of RA in brother we will rule out RA.  Labs pending. Rx for prednisone course sent for inflammation and swelling as she cannot take NSAID's.   Referral placed to orthopedics for trigger finger.

## 2018-10-02 NOTE — Patient Instructions (Addendum)
Stop by the lab prior to leaving today. I will notify you of your results once received.   You will be contacted regarding your referral to orthopedics.  Please let us know if you have not been contacted within one week.   Start prednisone tablets. Take three tablets for 2 days, then two tablets for 2 days, then one tablet for 2 days.  It was a pleasure to see you today!

## 2018-10-02 NOTE — Addendum Note (Signed)
Addended by: Jacqualin Combes on: 10/02/2018 10:53 AM   Modules accepted: Orders

## 2018-10-02 NOTE — Assessment & Plan Note (Signed)
Chronic and continued despite injections and PT in the past. Referral placed to orthopedics for evaluation. Likely needs MRI.

## 2018-10-02 NOTE — Progress Notes (Signed)
Subjective:    Patient ID: Judy Price, female    DOB: June 18, 1960, 58 y.o.   MRN: [id number]  HPI  Judy Price is a 58 year old female with a history of chronic knee, neck and shoulder pain who presents today with a chief complaint of hand pain and chronic shoulder pain. She is also needing a Shingrix vaccination.   Chronic bilateral shoulder pain, worse to right side. She's undergone steroid injections bilaterally, also physical therapy at one point. She's continued to experience chronic pain with radiation down right upper extremity. She has not undergone MRI, has not seen orthopedics.   Acute joint swelling and pain to most joints of her fingers on bilateral hands. This has been present for the last one month, stiffness with moderate swelling in the mornings. She's also noticed that her left third digit will "lock" at times with radiation of pain down to the hand. She's taken her gabapentin and also some Tylenol without much improvement. She cannot take NSAID's due to gastric bypass surgery.   Review of Systems  Musculoskeletal: Positive for arthralgias and joint swelling.  Skin: Negative for color change.       Past Medical History:  Diagnosis Date  . Chicken pox   . History of UTI   . Hypertension   . Iron deficiency anemia   . Shingles 03/19/2018     Social History   Socioeconomic History  . Marital status: Married    Spouse name: Not on file  . Number of children: Not on file  . Years of education: Not on file  . Highest education level: Not on file  Occupational History  . Not on file  Social Needs  . Financial resource strain: Not on file  . Food insecurity    Worry: Not on file    Inability: Not on file  . Transportation needs    Medical: Not on file    Non-medical: Not on file  Tobacco Use  . Smoking status: Never Smoker  . Smokeless tobacco: Never Used  Substance and Sexual Activity  . Alcohol use: No    Alcohol/week: 0.0 standard drinks  . Drug use:  No  . Sexual activity: Not on file  Lifestyle  . Physical activity    Days per week: Not on file    Minutes per session: Not on file  . Stress: Not on file  Relationships  . Social Musicianconnections    Talks on phone: Not on file    Gets together: Not on file    Attends religious service: Not on file    Active member of club or organization: Not on file    Attends meetings of clubs or organizations: Not on file    Relationship status: Not on file  . Intimate partner violence    Fear of current or ex partner: Not on file    Emotionally abused: Not on file    Physically abused: Not on file    Forced sexual activity: Not on file  Other Topics Concern  . Not on file  Social History Narrative   Married.   [place]   One child, one grandchild.   Home maker   Enjoys decorating, making jewelry.    Past Surgical History:  Procedure Laterality Date  . ABDOMINAL HYSTERECTOMY    . GASTRIC BYPASS    . TONSILLECTOMY AND ADENOIDECTOMY      Family History  Problem Relation Age of Onset  . Arthritis Mother   .  Cancer Mother        Breast tumors  . Hyperlipidemia Mother   . Heart disease Mother   . Stroke Mother   . Hypertension Mother   . Hyperlipidemia Father   . Heart disease Father   . Stroke Father   . Arthritis Maternal Grandmother   . Cancer Maternal Grandmother        Breast  . Hyperlipidemia Maternal Grandmother   . Heart disease Maternal Grandmother   . Stroke Maternal Grandmother   . Hypertension Maternal Grandmother   . Hyperlipidemia Maternal Grandfather   . Hyperlipidemia Paternal Grandmother   . Hyperlipidemia Paternal Grandfather     Allergies  Allergen Reactions  . Codeine     Current Outpatient Medications on File Prior to Visit  Medication Sig Dispense Refill  . gabapentin (NEURONTIN) 300 MG capsule Take 1 capsule (300 mg total) by mouth at bedtime. 90 capsule 0  . olopatadine (PATANOL) 0.1 % ophthalmic solution Place 1 drop into both eyes  2 (two) times daily. 5 mL 0  . PARoxetine (PAXIL-CR) 12.5 MG 24 hr tablet TAKE 1 TABLET DAILY FOR HOT FLASHES 90 tablet 0  . trimethoprim-polymyxin b (POLYTRIM) ophthalmic solution Place 1 drop into both eyes every 4 (four) hours. 10 mL 0   No current facility-administered medications on file prior to visit.     BP 108/68   Pulse (!) 57   Temp 97.8 F (36.6 C) (Temporal)   SpO2 98%    Objective:   Physical Exam  Constitutional: She appears well-nourished.  Musculoskeletal:     Right shoulder: She exhibits decreased range of motion and pain. She exhibits no tenderness and no swelling.     Comments: Evidence of heberden's nodes to right third DIP. Mild swelling to bilateral hands.  Mild decrease in ROM to right shoulder with lateral and posterior abduction. Strength 5/5 bilaterally to upper extremities.   Skin: Skin is warm and dry. No erythema.           Assessment & Plan:

## 2018-10-05 LAB — CYCLIC CITRUL PEPTIDE ANTIBODY, IGG: Cyclic Citrullin Peptide Ab: <16 UNITS

## 2018-10-05 LAB — RHEUMATOID FACTOR: Rheumatoid fact SerPl-aCnc: <14 IU/mL (ref <14)

## 2018-11-01 ENCOUNTER — Other Ambulatory Visit: Payer: Self-pay

## 2018-11-01 ENCOUNTER — Other Ambulatory Visit: Payer: Self-pay | Admitting: Primary Care

## 2018-11-01 DIAGNOSIS — G2581 Restless legs syndrome: Secondary | ICD-10-CM

## 2018-11-01 DIAGNOSIS — R232 Flushing: Secondary | ICD-10-CM

## 2018-11-02 MED ORDER — GABAPENTIN 300 MG PO CAPS: 300.0000 mg | ORAL_CAPSULE | Freq: Every day | ORAL | 1 refills | Status: DC

## 2018-11-02 MED ORDER — PAROXETINE HCL ER 12.5 MG PO TB24: ORAL_TABLET | ORAL | 1 refills | Status: DC

## 2018-11-03 ENCOUNTER — Other Ambulatory Visit: Payer: Self-pay | Admitting: Sports Medicine

## 2018-11-03 DIAGNOSIS — G8929 Other chronic pain: Secondary | ICD-10-CM

## 2018-11-03 DIAGNOSIS — M25511 Pain in right shoulder: Secondary | ICD-10-CM

## 2018-11-04 ENCOUNTER — Other Ambulatory Visit: Payer: Self-pay | Admitting: Sports Medicine

## 2018-11-13 ENCOUNTER — Ambulatory Visit: Payer: BC Managed Care – PPO

## 2018-11-24 DIAGNOSIS — H44003 Unspecified purulent endophthalmitis, bilateral: Secondary | ICD-10-CM

## 2018-11-24 HISTORY — DX: Unspecified purulent endophthalmitis, bilateral: H44.003

## 2018-12-03 ENCOUNTER — Ambulatory Visit (INDEPENDENT_AMBULATORY_CARE_PROVIDER_SITE_OTHER): Payer: BC Managed Care – PPO

## 2018-12-03 DIAGNOSIS — Z23 Encounter for immunization: Secondary | ICD-10-CM

## 2018-12-03 NOTE — Progress Notes (Signed)
Per orders of Alma Friendly, NP (co signed by Dr Glori Bickers in her absence)  injection of Shingrix # 2 given by Kris Mouton. Patient tolerated injection well.

## 2018-12-19 ENCOUNTER — Other Ambulatory Visit: Payer: Self-pay

## 2018-12-19 ENCOUNTER — Ambulatory Visit
Admission: RE | Admit: 2018-12-19 | Discharge: 2018-12-19 | Disposition: A | Discharge status: Home / Self Care | Payer: BC Managed Care – PPO | Source: Ambulatory Visit | Attending: Sports Medicine | Admitting: Sports Medicine

## 2018-12-19 DIAGNOSIS — M25511 Pain in right shoulder: Secondary | ICD-10-CM | POA: Insufficient documentation

## 2018-12-19 DIAGNOSIS — G8929 Other chronic pain: Secondary | ICD-10-CM | POA: Insufficient documentation

## 2018-12-23 ENCOUNTER — Ambulatory Visit: Payer: BC Managed Care – PPO

## 2019-01-05 ENCOUNTER — Other Ambulatory Visit: Payer: Self-pay

## 2019-01-05 ENCOUNTER — Encounter
Admission: RE | Admit: 2019-01-05 | Discharge: 2019-01-05 | Disposition: A | Discharge status: Home / Self Care | Payer: BC Managed Care – PPO | Source: Ambulatory Visit | Attending: Orthopedic Surgery | Admitting: Orthopedic Surgery

## 2019-01-05 DIAGNOSIS — Z01818 Encounter for other preprocedural examination: Secondary | ICD-10-CM | POA: Diagnosis present

## 2019-01-05 DIAGNOSIS — M75111 Incomplete rotator cuff tear or rupture of right shoulder, not specified as traumatic: Secondary | ICD-10-CM | POA: Diagnosis not present

## 2019-01-05 NOTE — Patient Instructions (Signed)
INSTRUCTIONS FOR SURGERY     Your surgery is scheduled for:   Monday, October 19TH     To find out your arrival time for the day of surgery,          please call [phone] between 1 pm and 3 pm on :  Friday, October 16TH     When you arrive for surgery, report to the SECOND FLOOR OF THE MEDICAL MALL.       Do NOT stop on the first floor to register.    REMEMBER: Instructions that are not followed completely may result in serious medical risk,  up to and including death, or upon the discretion of your surgeon and anesthesiologist,            your surgery may need to be rescheduled.  __X__ 1. Do not eat food after midnight the night before your procedure.                    No gum, candy, lozenger, tic tacs, tums or hard candies.                  ABSOLUTELY NOTHING SOLID IN YOUR MOUTH AFTER MIDNIGHT                    You may drink unlimited clear liquids up to 2 hours before you are scheduled to arrive for surgery.                   Do not drink anything within those 2 hours unless you need to take medicine, then take the                   smallest amount you need.  Clear liquids include:  water, apple juice without pulp,                   any flavor Gatorade, Black coffee, black tea.  Sugar may be added but no dairy/ honey /lemon.                        Broth and jello is not considered a clear liquid.  __x__  2. On the morning of surgery, please brush your teeth with toothpaste and water. You may rinse with                  mouthwash if you wish but DO NOT SWALLOW TOOTHPASTE OR MOUTHWASH  __X___3. NO alcohol for 24 hours before or after surgery.  __x___ 4.  Do NOT smoke or use e-cigarettes for 24 HOURS PRIOR TO SURGERY.                      DO NOT Use any chewable tobacco products for at least 6 hours prior to surgery.  __x___ 5. If you start any new medication after this appointment and prior to surgery, please       Bring it with you on the day of surgery.  ___x__ 6. Notify your doctor if there is any change in your medical condition, such as fever, infection, vomitting,  Diarrhea or any open sores.  __x___ 7.  USE the CHG SOAP as instructed, the night before surgery and the day of surgery.                   Once you have washed with this soap, do NOT use any of the following: Powders, perfumes                    or lotions. Please do not wear make up, hairpins, clips or nail polish. You MAY NOT  wear deodorant.                   Men may shave their face and neck.  Women need to shave 48 hours prior to surgery.                   DO NOT wear ANY jewelry on the day of surgery. If there are rings that are too tight to                    remove easily, please address this prior to the surgery day. Piercings need to be removed.                                                                     NO METAL ON YOUR BODY.                    Do NOT bring any valuables.  If you came to Pre-Admit testing then you will not need license,                     insurance card or credit card.  If you will be staying overnight, please either leave your things in                     the car or have your family be responsible for these items.                     [facility] IS NOT RESPONSIBLE FOR BELONGINGS OR VALUABLES.  ___X__ 8. DO NOT wear contact lenses on surgery day.  You may not have dentures,                     Hearing aides, contacts or glasses in the operating room. These items can be                    Placed in the Recovery Room to receive immediately after surgery.  __x___ 9. IF YOU ARE SCHEDULED TO GO HOME ON THE SAME DAY, YOU MUST                   Have someone to drive you home and to stay with you  for the first 24 hours.                    Have an arrangement prior to arriving on surgery day.  ___x__ 10. Take the following medications on the morning of surgery with a sip of water:  1. PAXIL                     2.                     3.                     4.                     5.                     6.  _____ 11.  Follow any instructions provided to you by your surgeon.                        Such as enema, clear liquid bowel prep  __X__  12. STOP  ASPIRIN AS OF: TODAY                       THIS INCLUDES BC POWDERS / GOODIES POWDER  __x___ 13. STOP Anti-inflammatories as of: TODAY                      This includes IBUPROFEN / MOTRIN / ADVIL / ALEVE/ NAPROXYN                    YOU MAY TAKE TYLENOL ANY TIME PRIOR TO SURGERY.  _____ 14.  Stop supplements until after surgery.                     This includes: MULTIVITS                 You may continue taking Vitamin B12 / Vitamin D3 but do not take on the morning of surgery.  _____ 15. Bring your CPAP machine into preop with you on the morning of surgery.  ______17. If staying overnight, please have appropriate shoes to wear to be able to walk around the unit.                   Wear clean and comfortable clothing to the hospital.  WEAR SOMETHING THAT IS EASY TO GET YOUR ARM INTO / LOOSE.  NO NAIL POLISH ON DAY OF SURGERY

## 2019-01-07 ENCOUNTER — Other Ambulatory Visit: Payer: Self-pay

## 2019-01-07 ENCOUNTER — Other Ambulatory Visit
Admission: RE | Admit: 2019-01-07 | Discharge: 2019-01-07 | Disposition: A | Discharge status: Home / Self Care | Payer: BC Managed Care – PPO | Source: Ambulatory Visit | Attending: Orthopedic Surgery | Admitting: Orthopedic Surgery

## 2019-01-07 DIAGNOSIS — Z20828 Contact with and (suspected) exposure to other viral communicable diseases: Secondary | ICD-10-CM | POA: Diagnosis present

## 2019-01-07 LAB — SARS CORONAVIRUS 2 (TAT 6-24 HRS): SARS Coronavirus 2: NEGATIVE

## 2019-01-11 ENCOUNTER — Encounter
Admission: RE | Disposition: A | Discharge status: Home / Self Care | Payer: Self-pay | Source: Home / Self Care | Attending: Orthopedic Surgery

## 2019-01-11 ENCOUNTER — Encounter: Payer: Self-pay | Admitting: *Deleted

## 2019-01-11 ENCOUNTER — Other Ambulatory Visit: Payer: Self-pay

## 2019-01-11 ENCOUNTER — Ambulatory Visit: Payer: BC Managed Care – PPO | Admitting: Anesthesiology

## 2019-01-11 ENCOUNTER — Ambulatory Visit
Admission: RE | Admit: 2019-01-11 | Discharge: 2019-01-11 | Disposition: A | Discharge status: Home / Self Care | Payer: BC Managed Care – PPO | Attending: Orthopedic Surgery | Admitting: Orthopedic Surgery

## 2019-01-11 ENCOUNTER — Ambulatory Visit: Payer: BC Managed Care – PPO

## 2019-01-11 DIAGNOSIS — M75111 Incomplete rotator cuff tear or rupture of right shoulder, not specified as traumatic: Secondary | ICD-10-CM | POA: Insufficient documentation

## 2019-01-11 DIAGNOSIS — Z79899 Other long term (current) drug therapy: Secondary | ICD-10-CM | POA: Diagnosis not present

## 2019-01-11 DIAGNOSIS — M7521 Bicipital tendinitis, right shoulder: Secondary | ICD-10-CM | POA: Diagnosis not present

## 2019-01-11 DIAGNOSIS — Z885 Allergy status to narcotic agent status: Secondary | ICD-10-CM | POA: Insufficient documentation

## 2019-01-11 DIAGNOSIS — M7541 Impingement syndrome of right shoulder: Secondary | ICD-10-CM | POA: Diagnosis not present

## 2019-01-11 HISTORY — PX: SHOULDER ARTHROSCOPY WITH SUBACROMIAL DECOMPRESSION AND OPEN ROTATOR C: SHX5688

## 2019-01-11 SURGERY — SHOULDER ARTHROSCOPY WITH SUBACROMIAL DECOMPRESSION AND OPEN ROTATOR CUFF REPAIR, OPEN BICEPS TENDON REPAIR
Anesthesia: General | Laterality: Right

## 2019-01-11 MED ORDER — BUPIVACAINE LIPOSOME 1.3 % IJ SUSP
INTRAMUSCULAR | Status: DC | PRN
  Administered 2019-01-11 (×4): 5 mL

## 2019-01-11 MED ORDER — MIDAZOLAM HCL 2 MG/2ML IJ SOLN
INTRAMUSCULAR | Status: DC | PRN
  Administered 2019-01-11: 2 mg via INTRAVENOUS

## 2019-01-11 MED ORDER — LIDOCAINE HCL (PF) 1 % IJ SOLN
INTRAMUSCULAR | Status: AC
  Filled 2019-01-11: qty 5

## 2019-01-11 MED ORDER — PROMETHAZINE HCL 25 MG/ML IJ SOLN
INTRAMUSCULAR | Status: AC
  Filled 2019-01-11: qty 1

## 2019-01-11 MED ORDER — OXYCODONE HCL 5 MG/5ML PO SOLN: 5.0000 mg | Freq: Once | ORAL | Status: DC | PRN

## 2019-01-11 MED ORDER — LIDOCAINE HCL (PF) 2 % IJ SOLN
INTRAMUSCULAR | Status: AC
  Filled 2019-01-11: qty 10

## 2019-01-11 MED ORDER — PROMETHAZINE HCL 25 MG/ML IJ SOLN
6.2500 mg | INTRAMUSCULAR | Status: DC | PRN
  Administered 2019-01-11 (×2): 6.25 mg via INTRAVENOUS

## 2019-01-11 MED ORDER — MIDAZOLAM HCL 2 MG/2ML IJ SOLN
INTRAMUSCULAR | Status: AC
  Administered 2019-01-11: 07:00:00 1 mg via INTRAVENOUS
  Filled 2019-01-11: qty 2

## 2019-01-11 MED ORDER — FENTANYL CITRATE (PF) 100 MCG/2ML IJ SOLN
INTRAMUSCULAR | Status: AC
  Administered 2019-01-11: 50 ug via INTRAVENOUS
  Filled 2019-01-11: qty 2

## 2019-01-11 MED ORDER — CEFAZOLIN SODIUM-DEXTROSE 2-4 GM/100ML-% IV SOLN
INTRAVENOUS | Status: AC
  Filled 2019-01-11: qty 100

## 2019-01-11 MED ORDER — DEXAMETHASONE SODIUM PHOSPHATE 10 MG/ML IJ SOLN
INTRAMUSCULAR | Status: DC | PRN
  Administered 2019-01-11: 10 mg via INTRAVENOUS

## 2019-01-11 MED ORDER — FENTANYL CITRATE (PF) 100 MCG/2ML IJ SOLN
INTRAMUSCULAR | Status: AC
  Filled 2019-01-11: qty 2

## 2019-01-11 MED ORDER — FAMOTIDINE 20 MG PO TABS
20.0000 mg | ORAL_TABLET | Freq: Once | ORAL | Status: AC
  Administered 2019-01-11: 06:00:00 20 mg via ORAL

## 2019-01-11 MED ORDER — NEOMYCIN-POLYMYXIN B GU 40-200000 IR SOLN
Status: DC | PRN
  Administered 2019-01-11: 2 mL

## 2019-01-11 MED ORDER — BUPIVACAINE LIPOSOME 1.3 % IJ SUSP
INTRAMUSCULAR | Status: AC
  Filled 2019-01-11: qty 20

## 2019-01-11 MED ORDER — PROPOFOL 10 MG/ML IV BOLUS
INTRAVENOUS | Status: DC | PRN
  Administered 2019-01-11: 150 mg via INTRAVENOUS

## 2019-01-11 MED ORDER — FENTANYL CITRATE (PF) 100 MCG/2ML IJ SOLN
INTRAMUSCULAR | Status: DC | PRN
  Administered 2019-01-11: 50 ug via INTRAVENOUS
  Administered 2019-01-11: 25 ug via INTRAVENOUS

## 2019-01-11 MED ORDER — SODIUM CHLORIDE 0.9 % IV SOLN
INTRAVENOUS | Status: DC | PRN
  Administered 2019-01-11: 20 ug/min via INTRAVENOUS

## 2019-01-11 MED ORDER — CEFAZOLIN SODIUM-DEXTROSE 2-4 GM/100ML-% IV SOLN
2.0000 g | Freq: Once | INTRAVENOUS | Status: AC
  Administered 2019-01-11: 2 g via INTRAVENOUS

## 2019-01-11 MED ORDER — LACTATED RINGERS IV SOLN
INTRAVENOUS | Status: DC
  Administered 2019-01-11 (×2): via INTRAVENOUS

## 2019-01-11 MED ORDER — BUPIVACAINE HCL (PF) 0.5 % IJ SOLN
INTRAMUSCULAR | Status: AC
  Filled 2019-01-11: qty 10

## 2019-01-11 MED ORDER — ROCURONIUM BROMIDE 50 MG/5ML IV SOLN
INTRAVENOUS | Status: AC
  Filled 2019-01-11: qty 1

## 2019-01-11 MED ORDER — ROCURONIUM BROMIDE 100 MG/10ML IV SOLN
INTRAVENOUS | Status: DC | PRN
  Administered 2019-01-11: 10 mg via INTRAVENOUS
  Administered 2019-01-11: 50 mg via INTRAVENOUS
  Administered 2019-01-11: 10 mg via INTRAVENOUS

## 2019-01-11 MED ORDER — FENTANYL CITRATE (PF) 100 MCG/2ML IJ SOLN
50.0000 ug | Freq: Once | INTRAMUSCULAR | Status: AC
  Administered 2019-01-11: 07:00:00 50 ug via INTRAVENOUS

## 2019-01-11 MED ORDER — FENTANYL CITRATE (PF) 100 MCG/2ML IJ SOLN: 25.0000 ug | INTRAMUSCULAR | Status: DC | PRN

## 2019-01-11 MED ORDER — MIDAZOLAM HCL 2 MG/2ML IJ SOLN
1.0000 mg | Freq: Once | INTRAMUSCULAR | Status: AC
  Administered 2019-01-11: 07:00:00 1 mg via INTRAVENOUS

## 2019-01-11 MED ORDER — ACETAMINOPHEN 500 MG PO TABS: 1000.0000 mg | ORAL_TABLET | Freq: Three times a day (TID) | ORAL | 2 refills | Status: DC

## 2019-01-11 MED ORDER — BUPIVACAINE HCL (PF) 0.5 % IJ SOLN
INTRAMUSCULAR | Status: DC | PRN
  Administered 2019-01-11 (×2): 5 mL

## 2019-01-11 MED ORDER — OXYCODONE HCL 5 MG PO TABS: 5.0000 mg | ORAL_TABLET | Freq: Once | ORAL | Status: DC | PRN

## 2019-01-11 MED ORDER — LIDOCAINE HCL (PF) 1 % IJ SOLN
INTRAMUSCULAR | Status: DC | PRN
  Administered 2019-01-11: 1 mL

## 2019-01-11 MED ORDER — GLYCOPYRROLATE 0.2 MG/ML IJ SOLN
INTRAMUSCULAR | Status: DC | PRN
  Administered 2019-01-11: 0.2 mg via INTRAVENOUS

## 2019-01-11 MED ORDER — NEOMYCIN-POLYMYXIN B GU 40-200000 IR SOLN
Status: AC
  Filled 2019-01-11: qty 2

## 2019-01-11 MED ORDER — LACTATED RINGERS IV SOLN
INTRAVENOUS | Status: DC | PRN
  Administered 2019-01-11: 4 mL

## 2019-01-11 MED ORDER — LIDOCAINE HCL (CARDIAC) PF 100 MG/5ML IV SOSY
PREFILLED_SYRINGE | INTRAVENOUS | Status: DC | PRN
  Administered 2019-01-11: 60 mg via INTRAVENOUS

## 2019-01-11 MED ORDER — DEXAMETHASONE SODIUM PHOSPHATE 10 MG/ML IJ SOLN
INTRAMUSCULAR | Status: AC
  Filled 2019-01-11: qty 1

## 2019-01-11 MED ORDER — EPINEPHRINE PF 1 MG/ML IJ SOLN
INTRAMUSCULAR | Status: AC
  Filled 2019-01-11: qty 4

## 2019-01-11 MED ORDER — OXYCODONE HCL 5 MG PO TABS: 5.0000 mg | ORAL_TABLET | ORAL | 0 refills | Status: DC | PRN

## 2019-01-11 MED ORDER — BUPIVACAINE-EPINEPHRINE (PF) 0.5% -1:200000 IJ SOLN
INTRAMUSCULAR | Status: AC
  Filled 2019-01-11: qty 30

## 2019-01-11 MED ORDER — MIDAZOLAM HCL 2 MG/2ML IJ SOLN
INTRAMUSCULAR | Status: AC
  Filled 2019-01-11: qty 2

## 2019-01-11 MED ORDER — SUGAMMADEX SODIUM 200 MG/2ML IV SOLN
INTRAVENOUS | Status: DC | PRN
  Administered 2019-01-11: 167.8 mg via INTRAVENOUS

## 2019-01-11 MED ORDER — ASPIRIN EC 325 MG PO TBEC: 325.0000 mg | DELAYED_RELEASE_TABLET | Freq: Every day | ORAL | 0 refills | Status: AC

## 2019-01-11 MED ORDER — ONDANSETRON 4 MG PO TBDP: 4.0000 mg | ORAL_TABLET | Freq: Three times a day (TID) | ORAL | 0 refills | Status: DC | PRN

## 2019-01-11 MED ORDER — SODIUM CHLORIDE FLUSH 0.9 % IV SOLN
INTRAVENOUS | Status: AC
  Filled 2019-01-11: qty 10

## 2019-01-11 MED ORDER — ONDANSETRON HCL 4 MG/2ML IJ SOLN
INTRAMUSCULAR | Status: AC
  Filled 2019-01-11: qty 2

## 2019-01-11 MED ORDER — SUGAMMADEX SODIUM 200 MG/2ML IV SOLN
INTRAVENOUS | Status: AC
  Filled 2019-01-11: qty 2

## 2019-01-11 MED ORDER — FAMOTIDINE 20 MG PO TABS
ORAL_TABLET | ORAL | Status: AC
  Administered 2019-01-11: 06:00:00 20 mg via ORAL
  Filled 2019-01-11: qty 1

## 2019-01-11 MED ORDER — PROPOFOL 10 MG/ML IV BOLUS
INTRAVENOUS | Status: AC
  Filled 2019-01-11: qty 20

## 2019-01-11 MED ORDER — LIDOCAINE HCL (PF) 1 % IJ SOLN
INTRAMUSCULAR | Status: AC
  Filled 2019-01-11: qty 30

## 2019-01-11 MED ORDER — ONDANSETRON HCL 4 MG/2ML IJ SOLN
INTRAMUSCULAR | Status: DC | PRN
  Administered 2019-01-11: 4 mg via INTRAVENOUS

## 2019-01-11 SURGICAL SUPPLY — 71 items
ADAPTER IRRIG TUBE 2 SPIKE SOL (ADAPTER) ×4 IMPLANT
ANCHOR ALL-SUT Q-FIX 2.8 (Anchor) ×2 IMPLANT
ANCHOR BONE REGENETEN (Anchor) ×2 IMPLANT
ANCHOR TENDON REGENETEN (Staple) ×2 IMPLANT
BRUSH SCRUB EZ  4% CHG (MISCELLANEOUS) ×1
BRUSH SCRUB EZ 4% CHG (MISCELLANEOUS) ×1 IMPLANT
BUR RADIUS 4.0X18.5 (BURR) ×2 IMPLANT
BUR RADIUS 5.5 (BURR) ×2 IMPLANT
CANNULA PART THRD DISP 5.75X7 (CANNULA) ×4 IMPLANT
CANNULA PARTIAL THREAD 2X7 (CANNULA) ×2 IMPLANT
CANNULA TWIST IN 8.25X9CM (CANNULA) IMPLANT
CHLORAPREP W/TINT 26 (MISCELLANEOUS) ×2 IMPLANT
COOLER POLAR GLACIER W/PUMP (MISCELLANEOUS) ×2 IMPLANT
COVER WAND RF STERILE (DRAPES) ×2 IMPLANT
CRADLE LAMINECT ARM (MISCELLANEOUS) ×2 IMPLANT
DRAPE 3/4 80X56 (DRAPES) ×2 IMPLANT
DRAPE INCISE IOBAN 66X45 STRL (DRAPES) ×2 IMPLANT
DRAPE SPLIT 6X30 W/TAPE (DRAPES) ×4 IMPLANT
DRAPE STERI 35X30 U-POUCH (DRAPES) ×2 IMPLANT
DRSG TEGADERM 4X4.75 (GAUZE/BANDAGES/DRESSINGS) ×4 IMPLANT
ELECT REM PT RETURN 9FT ADLT (ELECTROSURGICAL)
ELECTRODE REM PT RTRN 9FT ADLT (ELECTROSURGICAL) IMPLANT
GAUZE SPONGE 4X4 12PLY STRL (GAUZE/BANDAGES/DRESSINGS) ×2 IMPLANT
GAUZE XEROFORM 1X8 LF (GAUZE/BANDAGES/DRESSINGS) ×2 IMPLANT
GLOVE BIO SURGEON STRL SZ7.5 (GLOVE) ×2 IMPLANT
GLOVE BIOGEL PI IND STRL 8 (GLOVE) ×2 IMPLANT
GLOVE BIOGEL PI INDICATOR 8 (GLOVE) ×2
GLOVE SURG SYN 7.5  E (GLOVE) ×1
GLOVE SURG SYN 7.5 E (GLOVE) ×1 IMPLANT
GOWN STRL REUS W/ TWL LRG LVL3 (GOWN DISPOSABLE) ×2 IMPLANT
GOWN STRL REUS W/TWL LRG LVL3 (GOWN DISPOSABLE) ×2
GOWN STRL REUS W/TWL LRG LVL4 (GOWN DISPOSABLE) ×2 IMPLANT
IMPL REGENETEN MEDIUM (Shoulder) ×1 IMPLANT
IMPLANT REGENETEN MEDIUM (Shoulder) ×2 IMPLANT
IV LACTATED RINGER IRRG 3000ML (IV SOLUTION) ×4
IV LR IRRIG 3000ML ARTHROMATIC (IV SOLUTION) ×4 IMPLANT
KIT STABILIZATION SHOULDER (MISCELLANEOUS) ×2 IMPLANT
KIT SUTURE 2.8 Q-FIX DISP (MISCELLANEOUS) ×2 IMPLANT
KIT SUTURETAK 3.0 INSERT PERC (KITS) ×2 IMPLANT
KIT TURNOVER KIT A (KITS) ×2 IMPLANT
MANIFOLD NEPTUNE II (INSTRUMENTS) ×2 IMPLANT
MASK FACE SPIDER DISP (MASK) ×2 IMPLANT
MAT ABSORB  FLUID 56X50 GRAY (MISCELLANEOUS) ×2
MAT ABSORB FLUID 56X50 GRAY (MISCELLANEOUS) ×2 IMPLANT
NDL SAFETY ECLIPSE 18X1.5 (NEEDLE) ×1 IMPLANT
NEEDLE HYPO 18GX1.5 SHARP (NEEDLE) ×1
NEEDLE HYPO 22GX1.5 SAFETY (NEEDLE) ×2 IMPLANT
NS IRRIG 500ML POUR BTL (IV SOLUTION) ×2 IMPLANT
PACK ARTHROSCOPY SHOULDER (MISCELLANEOUS) ×2 IMPLANT
PAD ABD DERMACEA PRESS 5X9 (GAUZE/BANDAGES/DRESSINGS) ×2 IMPLANT
PAD WRAPON POLAR SHDR XLG (MISCELLANEOUS) ×1 IMPLANT
SET TUBE SUCT SHAVER OUTFL 24K (TUBING) ×2 IMPLANT
SET TUBE TIP INTRA-ARTICULAR (MISCELLANEOUS) ×2 IMPLANT
SLING ULTRA II M (MISCELLANEOUS) ×2 IMPLANT
STRAP SAFETY 5IN WIDE (MISCELLANEOUS) ×2 IMPLANT
SUT ETHILON 3-0 FS-10 30 BLK (SUTURE) ×2
SUT LASSO 90 DEG SD STR (SUTURE) ×2 IMPLANT
SUT MNCRL 4-0 (SUTURE) ×1
SUT MNCRL 4-0 27XMFL (SUTURE) ×1
SUT PDS AB 0 CT1 27 (SUTURE) ×2 IMPLANT
SUT VIC AB 0 SH 27 (SUTURE) ×2 IMPLANT
SUT VIC AB 2-0 SH 27 (SUTURE) ×1
SUT VIC AB 2-0 SH 27XBRD (SUTURE) ×1 IMPLANT
SUTURE EHLN 3-0 FS-10 30 BLK (SUTURE) ×1 IMPLANT
SUTURE MNCRL 4-0 27XMF (SUTURE) ×1 IMPLANT
SYR 10ML LL (SYRINGE) ×2 IMPLANT
TAPE CLOTH 3X10 WHT NS LF (GAUZE/BANDAGES/DRESSINGS) ×2 IMPLANT
TUBING ARTHRO INFLOW-ONLY STRL (TUBING) ×2 IMPLANT
TUBING CONNECTING 10 (TUBING) ×2 IMPLANT
WAND WEREWOLF FLOW 90D (MISCELLANEOUS) IMPLANT
WRAPON POLAR PAD SHDR XLG (MISCELLANEOUS) ×2

## 2019-01-11 NOTE — Anesthesia Post-op Follow-up Note (Signed)
Anesthesia QCDR form completed.        

## 2019-01-11 NOTE — Anesthesia Preprocedure Evaluation (Addendum)
Anesthesia Evaluation  Patient identified by MRN, date of birth, ID band Patient awake    Reviewed: Allergy & Precautions, H&P , NPO status , Patient's Chart, lab work & pertinent test results  Airway Mallampati: III  TM Distance: >3 FB Neck ROM: full    Dental  (+) Teeth Intact   Pulmonary neg pulmonary ROS, neg COPD,           Cardiovascular (-) angina(-) Past MI and (-) Cardiac Stents negative cardio ROS       Neuro/Psych negative neurological ROS  negative psych ROS   GI/Hepatic negative GI ROS, Neg liver ROS,   Endo/Other  negative endocrine ROS  Renal/GU      Musculoskeletal   Abdominal   Peds  Hematology  (+) Blood dyscrasia, anemia ,   Anesthesia Other Findings Past Medical History: No date: Chicken pox 11/2018: Eye infection, bilateral No date: History of UTI No date: Iron deficiency anemia     Comment:  in the past 03/19/2018: Shingles     Comment:  also had a bout in september 2020. nothing active now  Past Surgical History: 2010: ABDOMINAL HYSTERECTOMY 2008: GASTRIC BYPASS No date: TONSILLECTOMY AND ADENOIDECTOMY  BMI    Body Mass Index: 27.32 kg/m      Reproductive/Obstetrics negative OB ROS                             Anesthesia Physical Anesthesia Plan  ASA: II  Anesthesia Plan: General ETT   Post-op Pain Management: GA combined w/ Regional for post-op pain   Induction:   PONV Risk Score and Plan: Ondansetron, Dexamethasone, Midazolam and Treatment may vary due to age or medical condition  Airway Management Planned:   Additional Equipment:   Intra-op Plan:   Post-operative Plan: Extubation in OR  Informed Consent: I have reviewed the patients History and Physical, chart, labs and discussed the procedure including the risks, benefits and alternatives for the proposed anesthesia with the patient or authorized representative who has indicated his/her  understanding and acceptance.     Dental Advisory Given  Plan Discussed with: Anesthesiologist, CRNA and Surgeon  Anesthesia Plan Comments:         Anesthesia Quick Evaluation

## 2019-01-11 NOTE — H&P (Signed)
Paper H&P to be scanned into permanent record. H&P reviewed. No significant changes noted.  

## 2019-01-11 NOTE — Op Note (Signed)
SURGERY DATE: 01/11/2019  PRE-OP DIAGNOSIS:  1. Right subacromial impingement 2. Right biceps tendinopathy 3. Right partial thickness rotator cuff tear  POST-OP DIAGNOSIS: 1. Right subacromial impingement 2. Right biceps tendinopathy 3. Right partial thickness rotator cuff tear  PROCEDURES:  1. Right mini-open rotator cuff repair with Regeneten patch 2. Right open biceps tenodesis 3. Right extensive debridement of shoulder (glenohumeral and subacromial spaces) 4. Right subacromial decompression  SURGEON: Cato Mulligan, MD   ASSISTANT: Orland Penman, PA-S  ANESTHESIA: Gen with interscalene block w/Exparil  ESTIMATED BLOOD LOSS: 25cc  DRAINS:  none  TOTAL IV FLUIDS: per anesthesia   SPECIMENS: none  IMPLANTS:  - [place] patch with associated tendon and bone staples - [employer] Q-fix All Suture Anchor - x1  OPERATIVE FINDINGS:  Examination under anesthesia: A careful examination under anesthesia was performed.  Passive range of motion was: FF: 150; ER at side: 50; ER in abduction: 90; IR in abduction: 50.  Anterior load shift: NT.  Posterior load shift: NT.  Sulcus in neutral: NT.  Sulcus in ER: NT.    Intra-operative findings: A thorough arthroscopic examination of the shoulder was performed.  The findings are: 1. Biceps tendon: tendinopathy with thickening and small split-tear with erythema 2. Superior labrum: Normal 3. Posterior labrum and capsule: normal 4. Inferior capsule and inferior recess: normal 5. Glenoid cartilage surface: Normal 6. Supraspinatus attachment: partial thickness tearing of the bursal side 7. Posterior rotator cuff attachment: normal 8. Humeral head articular cartilage: normal 9. Rotator interval: normal 10: Subscapularis tendon: normal, no grossly visible or palpable calcification within substance of subscapularis 11. Anterior labrum: mildly degenerative 12. IGHL: normal  OPERATIVE REPORT:   Indications for procedure:  Judy Price is a 58 y.o. year old female with approximately 6 months of worsening shoulder pain that began without traumatic incident.  She has failed nonoperative management the form of medications, corticosteroid injections, exercises, and activity modifications.  MRI demonstrated significant biceps tendinopathy, partial-thickness rotator cuff tearing of the interstitial fibers, significant subacromial/subdeltoid bursitis, and subacromial impingement.  After discussion of risks, benefits, and alternatives to surgery, the patient elected to proceed with above mentioned procedure. The patient understands that use of the Regeneten patch is relatively new and long-term data is unknown.  Procedure in detail:  I identified Judy Price in the pre-operative holding area.  I marked the operative shoulder with my initials. I reviewed the risks and benefits of the proposed surgical intervention, and the patient (and/or patient's guardian) wished to proceed.  Anesthesia was then performed with an interscalene block with Exparil.  The patient was transferred to the operative suite and placed in the beach chair position.    SCDs were placed on the lower extremities. Appropriate IV antibiotics were administered. The operative upper extremity was then prepped and draped in standard fashion. A time out was performed confirming the correct extremity, correct patient and correct procedure.   I then created a standard posterior portal with an 11 blade. The glenohumeral joint was easily entered with a blunt trochar and the arthroscope introduced. The findings of diagnostic arthroscopy are described above. I debrided the degenerative anterior labrum and also debrided and coagulated the inflamed synovium to obtain hemostasis and reduce the risk of post-operative swelling using an Arthrocare radiofrequency device.  I performed a biceps tenotomy using an arthroscopic scissors and used a motorized shaver to debride the stump back  to a stable base.   Next, the arthroscope was then introduced into the  subacromial space. A direct lateral portal was created with an 11-blade after spinal needle localization. An extensive subacromial bursectomy was performed using a combination of the shaver and Arthrocare wand. The entire acromial undersurface was exposed and the CA ligament was subperiosteally elevated to expose the prominent anterior acromial hook. A burr was used to create a flat anterior and lateral aspect of the acromion, converting it from a Type 2 to a Type 1 acromion. Care was made to keep the deltoid fascia intact.  A longitudinal incision from the anterolateral acromion ~6cm in length was made overlying the raphe between the anterior and middle heads of the deltoid. The raphe was identified and it was incised. The subacromial space was identified. Any remaining bursa was excised. We then turned our attention to the biceps tenodesis. The arm was externally rotated.  The bicipital groove was identified.  A 15 blade was used to make a cut overlying the biceps tendon, and the tendon was removed using a right angle clamp.  The base of the bicipital groove was identified and cleared of soft tissue.  A Q fix anchor was placed in the bicipital groove.  The biceps tendon was held at the appropriate amount of tension.  One set of sutures was passed through the biceps anchor with one limb passed in a simple fashion and the second limb passed in a simple plus locking stitch pattern.  This was repeated for the other set of sutures.  This construct allowed for shuttling the biceps tendon down to the bone.  The sutures were tied and cut.  The diseased portion of the proximal biceps was then excised.  The arm was then internally rotated.  The rotator cuff was reexamined from the bursal side.  There were some small areas of the supraspinatus with partial-thickness tearing. There regions were probed, and there was no full-thickness tear. We decided  to proceed with Regeneten patch placement. The Regeneten patch delivery gun was placed appropriately and the patch was delivered over the supraspinatus tendon. It was positioned such that all areas of partial-thickness rotator cuff tear were covered. Tendon staples were placed medially, anteriorly, and posteriorly. Two bone staples were then placed laterally. The patch was then probed to confirm appropriate stability.   The wound was thoroughly irrigated.  The deltoid split was closed with 0 Vicryl.  The subdermal layer was closed with 2-0 Vicryl.  The skin was closed with 4-0 Monocryl and Dermabond. The portals were closed with 3-0 Nylon. Xeroform was applied to the portals. A sterile dressing was applied, followed by a Polar Care sleeve and a SlingShot shoulder immobilizer/sling. The patient awoke from anesthesia without difficulty and was transferred to the PACU in stable condition.    COMPLICATIONS: none  DISPOSITION: plan for discharge home after recovery in PACU  POSTOPERATIVE PLAN: Remain in sling (except hygiene and elbow/wrist/hand RoM exercises as instructed by PT) x 4 weeks and NWB for this time. PT to begin 3-4 days after surgery. Use biceps tenodesis protocol as Regeneten patch protocol is too aggressive for biceps tenodesis healing.

## 2019-01-11 NOTE — Anesthesia Postprocedure Evaluation (Signed)
Anesthesia Post Note  Patient: Judy Price  Procedure(s) Performed: SHOULDER ARTHROSCOPY WITH SUBACROMIAL DECOMPRESSION ,OPEN BICEP TENDON REPAIR, MINI OPEN REGENTEN PATCH APPLICATION (Right )  Patient location during evaluation: PACU Anesthesia Type: General Level of consciousness: awake and alert Pain management: pain level controlled Vital Signs Assessment: post-procedure vital signs reviewed and stable Respiratory status: spontaneous breathing, nonlabored ventilation and respiratory function stable Cardiovascular status: blood pressure returned to baseline and stable Postop Assessment: no apparent nausea or vomiting Anesthetic complications: no     Last Vitals:  Vitals:   01/11/19 1201 01/11/19 1356  BP: 131/72 139/66  Pulse: 63 (!) 55  Resp: 20 18  Temp: (!) 36.1 C   SpO2: 99% 100%    Last Pain:  Vitals:   01/11/19 1201  TempSrc: Oral  PainSc: 0-No pain                 Durenda Hurt

## 2019-01-11 NOTE — Anesthesia Procedure Notes (Signed)
Procedure Name: Intubation Date/Time: 01/11/2019 7:49 AM Performed by: Jerrye Noble, CRNA Pre-anesthesia Checklist: Patient identified, Emergency Drugs available, Suction available, Patient being monitored and Timeout performed Patient Re-evaluated:Patient Re-evaluated prior to induction Oxygen Delivery Method: Circle system utilized Preoxygenation: Pre-oxygenation with 100% oxygen Induction Type: IV induction Ventilation: Mask ventilation without difficulty Laryngoscope Size: McGraph and 3 Grade View: Grade I Tube type: Oral Tube size: 7.0 mm Number of attempts: 2 Airway Equipment and Method: Stylet Placement Confirmation: ETT inserted through vocal cords under direct vision,  positive ETCO2 and breath sounds checked- equal and bilateral Secured at: 22 cm Tube secured with: Tape Dental Injury: Injury to lip and Teeth and Oropharynx as per pre-operative assessment  Comments: Grade 3 view with MAC 3.  Cords visualized with McGraph.  Small injury noted to Left upper lip, ointment applied.

## 2019-01-11 NOTE — Transfer of Care (Signed)
Immediate Anesthesia Transfer of Care Note  Patient: Judy Price  Procedure(s) Performed: SHOULDER ARTHROSCOPY WITH SUBACROMIAL DECOMPRESSION ,OPEN BICEP TENDON REPAIR, MINI OPEN REGENTEN PATCH APPLICATION (Right )  Patient Location: PACU  Anesthesia Type:General  Level of Consciousness: awake and drowsy  Airway & Oxygen Therapy: Patient Spontanous Breathing and Patient connected to face mask oxygen  Post-op Assessment: Report given to RN and Post -op Vital signs reviewed and stable  Post vital signs: Reviewed and stable  Last Vitals:  Vitals Value Taken Time  BP    Temp 36.5 C 01/11/19 1001  Pulse    Resp    SpO2      Last Pain:  Vitals:   01/11/19 0611  TempSrc: Tympanic  PainSc: 5       Patients Stated Pain Goal: 0 (48/54/62 [id number])  Complications: No apparent anesthesia complications

## 2019-01-11 NOTE — Anesthesia Procedure Notes (Signed)
Anesthesia Regional Block: Interscalene brachial plexus block   Pre-Anesthetic Checklist: ,, timeout performed, Correct Patient, Correct Site, Correct Laterality, Correct Procedure, Correct Position, site marked, Risks and benefits discussed,  Surgical consent,  Pre-op evaluation,  At surgeon's request and post-op pain management  Laterality: Right  Prep: chloraprep       Needles:  Injection technique: Single-shot  Needle Type: Stimiplex     Needle Length: 9cm  Needle Gauge: 20     Additional Needles:   Procedures:,,,, ultrasound used (permanent image in chart),,,,  Narrative:  Start time: 01/11/2019 7:09 AM End time: 01/11/2019 7:19 AM  Performed by: Personally  Anesthesiologist: Durenda Hurt, MD  Additional Notes: Negative aspiration.  Negative paresthesia on injection.  Dose given in divided aliquots under ultrasound guidance.  KLF

## 2019-01-11 NOTE — Discharge Instructions (Addendum)
AMBULATORY SURGERY  DISCHARGE INSTRUCTIONS   1) The drugs that you were given will stay in your system until tomorrow so for the next 24 hours you should not:  A) Drive an automobile B) Make any legal decisions C) Drink any alcoholic beverage   2) You may resume regular meals tomorrow.  Today it is better to start with liquids and gradually work up to solid foods.  You may eat anything you prefer, but it is better to start with liquids, then soup and crackers, and gradually work up to solid foods.   3) Please notify your doctor immediately if you have any unusual bleeding, trouble breathing, redness and pain at the surgery site, drainage, fever, or pain not relieved by medication.    4) Additional Instructions:        Please contact your physician with any problems or Same Day Surgery at [phone], Monday through Friday 6 am to 4 pm, or [place] [facility] number at [phone].Post-Op Instructions - Arthroscopic Shoulder Surgery with Biceps Tenodesis  1. Bracing: You will wear a shoulder immobilizer or sling for 4 weeks.   2. Driving: When driving, do not wear the immobilizer. Ideally, we recommend no driving for 4 weeks while sling is in place as one arm will be immobilized. If you must drive, you may drive with the arm in the sling at 2 weeks if off of narcotic medications.   3. Activity: No active lifting for 6 weeks. Wrist, hand, and elbow motion only. You are permitted to bend and straighten the elbow passively only (no active elbow motion). You may use your hand and wrist for typing, writing, and managing utensils (cutting food). Do not lift more than a coffee cup for 6 weeks.  When sleeping or resting, inclined positions (recliner chair or wedge pillow) and a pillow under the forearm for support may provide better comfort for up to 4 weeks.  Avoid long distance travel for 4 weeks.  Return to normal activities normally takes 4 months on average. If rehab goes  very well, may be able to do most activities at 3 months, except overhead or contact sports.  4. Physical Therapy: Begins 3-4 days after surgery, and proceed 1 time per week for the first 6 weeks, then 1-2 times per week from weeks 6-20 post-op.  5. Medications:  - You will be provided a prescription for narcotic pain medicine. After surgery, take 1-2 narcotic tablets every 4 hours if needed for severe pain.  - A prescription for anti-nausea medication will be provided in case the narcotic medicine causes nausea - take 1 tablet every 6 hours only if nauseated.   - Take tylenol 1000 mg (2 Extra Strength tablets or 3 regular strength) every 8 hours for pain.  May decrease or stop tylenol 5 days after surgery if you are having minimal pain.    If you are taking prescription medication for anxiety, depression, insomnia, muscle spasm, chronic pain, or for attention deficit disorder, you are advised that you are at a higher risk of adverse effects with use of narcotics post-op, including narcotic addiction/dependence, depressed breathing, death. If you use non-prescribed substances: alcohol, marijuana, cocaine, heroin, methamphetamines, etc., you are at a higher risk of adverse effects with use of narcotics post-op, including narcotic addiction/dependence, depressed breathing, death. You are advised that taking > 50 morphine milligram equivalents (MME) of narcotic pain medication per day results in twice the risk of overdose or death. For your prescription provided: oxycodone 5 mg -  taking more than 6 tablets per day would result in > 50 morphine milligram equivalents (MME) of narcotic pain medication. Be advised that we will prescribe narcotics short-term, for acute post-operative pain only - 3 weeks for major operations such as shoulder repair/reconstruction surgeries.    6. Post-Op Appointment:  Your first post-op appointment will be 10-14 days post-op.  7. Work or School: For most, but not all  procedures, we advise staying out of work or school for at least 1 to 2 weeks in order to recover from the stress of surgery and to allow time for healing.   If you need a work or school note this can be provided.   8. Smoking: If you are a smoker, you need to refrain from smoking in the postoperative period. The nicotine in cigarettes will inhibit healing of your shoulder repair and decrease the chance of successful repair. Similarly, nicotine containing products (gum, patches) should be avoided.   Post-operative Brace: Apply and remove the brace you received as you were instructed to at the time of fitting and as described in detail as the braces instructions for use indicate.  Wear the brace for the period of time prescribed by your physician.  The brace can be cleaned with soap and water and allowed to air dry only.  Should the brace result in increased pain, decreased feeling (numbness/tingling), increased swelling or an overall worsening of your medical condition, please contact your doctor immediately.  If an emergency situation occurs as a result of wearing the brace after normal business hours, please dial 911 and seek immediate medical attention.  Let your doctor know if you have any further questions about the brace issued to you. Refer to the shoulder sling instructions for use if you have any questions regarding the correct fit of your shoulder sling.  [facility]: [phone]  Video that illustrates how to properly use a shoulder sling: "Instructions for Proper Use of an Orthopaedic Sling" [email or web address]

## 2019-01-12 ENCOUNTER — Encounter: Payer: Self-pay | Admitting: Orthopedic Surgery

## 2019-01-28 ENCOUNTER — Telehealth: Payer: BC Managed Care – PPO | Admitting: Emergency Medicine

## 2019-01-28 DIAGNOSIS — J0141 Acute recurrent pansinusitis: Secondary | ICD-10-CM | POA: Diagnosis not present

## 2019-01-28 MED ORDER — AMOXICILLIN-POT CLAVULANATE 875-125 MG PO TABS: 1.0000 | ORAL_TABLET | Freq: Two times a day (BID) | ORAL | 0 refills | Status: DC

## 2019-01-28 NOTE — Progress Notes (Signed)
We are sorry that you are not feeling well.  Here is how we plan to help!  Based on what you have shared with me it looks like you have sinusitis.  Sinusitis is inflammation and infection in the sinus cavities of the head.  Based on your presentation I believe you most likely have Acute Bacterial Sinusitis.  This is an infection caused by bacteria and is treated with antibiotics. I have prescribed Augmentin 875mg/125mg one tablet twice daily with food, for 7 days. You may use an oral decongestant such as Mucinex D or if you have glaucoma or high blood pressure use plain Mucinex. Saline nasal spray help and can safely be used as often as needed for congestion.  If you develop worsening sinus pain, fever or notice severe headache and vision changes, or if symptoms are not better after completion of antibiotic, please schedule an appointment with a health care provider.    Sinus infections are not as easily transmitted as other respiratory infection, however we still recommend that you avoid close contact with loved ones, especially the very young and elderly.  Remember to wash your hands thoroughly throughout the day as this is the number one way to prevent the spread of infection!  Home Care:  Only take medications as instructed by your medical team.  Complete the entire course of an antibiotic.  Do not take these medications with alcohol.  A steam or ultrasonic humidifier can help congestion.  You can place a towel over your head and breathe in the steam from hot water coming from a faucet.  Avoid close contacts especially the very young and the elderly.  Cover your mouth when you cough or sneeze.  Always remember to wash your hands.  Get Help Right Away If:  You develop worsening fever or sinus pain.  You develop a severe head ache or visual changes.  Your symptoms persist after you have completed your treatment plan.  Make sure you  Understand these instructions.  Will watch your  condition.  Will get help right away if you are not doing well or get worse.  Your e-visit answers were reviewed by a board certified advanced clinical practitioner to complete your personal care plan.  Depending on the condition, your plan could have included both over the counter or prescription medications.  If there is a problem please reply  once you have received a response from your provider.  Your safety is important to us.  If you have drug allergies check your prescription carefully.    You can use MyChart to ask questions about today's visit, request a non-urgent call back, or ask for a work or school excuse for 24 hours related to this e-Visit. If it has been greater than 24 hours you will need to follow up with your provider, or enter a new e-Visit to address those concerns.  You will get an e-mail in the next two days asking about your experience.  I hope that your e-visit has been valuable and will speed your recovery. Thank you for using e-visits.  Greater than 5 but less than 10 minutes spent researching, coordinating, and implementing care for this patient today    

## 2019-02-22 ENCOUNTER — Other Ambulatory Visit: Payer: Self-pay

## 2019-02-22 ENCOUNTER — Ambulatory Visit
Admission: RE | Admit: 2019-02-22 | Discharge: 2019-02-22 | Disposition: A | Discharge status: Home / Self Care | Payer: BC Managed Care – PPO | Source: Ambulatory Visit | Attending: Family Medicine | Admitting: Family Medicine

## 2019-02-22 ENCOUNTER — Ambulatory Visit (INDEPENDENT_AMBULATORY_CARE_PROVIDER_SITE_OTHER): Payer: BC Managed Care – PPO | Admitting: Family Medicine

## 2019-02-22 ENCOUNTER — Encounter: Payer: Self-pay | Admitting: Family Medicine

## 2019-02-22 VITALS — BP 130/80 | HR 71 | Temp 97.8°F | Ht 69.0 in

## 2019-02-22 DIAGNOSIS — M79604 Pain in right leg: Secondary | ICD-10-CM

## 2019-02-22 LAB — D-DIMER, QUANTITATIVE: D-Dimer, Quant: 0.65 mcg/mL FEU — ABNORMAL HIGH (ref <0.50)

## 2019-02-22 NOTE — Patient Instructions (Signed)
Go to the lab on the way out.  We'll contact you with your lab report. If D dimer is positive, you'll need an ultrasound.   If negative, then use ice and a knee sleeve and update [place] as needed.   Take care.  Glad to see you.

## 2019-02-22 NOTE — Progress Notes (Signed)
This visit occurred during the SARS-CoV-2 public health emergency.  Safety protocols were in place, including screening questions prior to the visit, additional usage of staff PPE, and extensive cleaning of exam room while observing appropriate contact time as indicated for disinfecting solutions.   Recent R shoulder surgery noted.  She has been going to PT. She is better than prev, still sore but having less pain compared to immediate post op pain.   R knee pain over the last 4-5 days.  No trigger or falls.  No L knee pain.  Pain much worse going on up steps.  Pain with rolling over, pain with first steps on flat ground but then some better thereafter if on flat ground.  No swelling.  No bruising, no redness.  No FCNAVD.  Pain with ROM, posterior pain.  No CP, SOB.  No h/o DVT.  She is 6 weeks out from surgery.    Meds, vitals, and allergies reviewed.   ROS: Per HPI unless specifically indicated in ROS section   nad ncat Right shoulder in sling. rrr ctab B 42cm calf.   Right knee exam with popliteal area not ttp More ttp in the lateral proximal shin.   Normal ROM.  No crepitus.  Able to bear weight.

## 2019-02-24 ENCOUNTER — Ambulatory Visit: Payer: BC Managed Care – PPO | Admitting: Primary Care

## 2019-02-24 DIAGNOSIS — M79604 Pain in right leg: Secondary | ICD-10-CM | POA: Insufficient documentation

## 2019-02-24 NOTE — Assessment & Plan Note (Signed)
D-dimer positive but follow-up ultrasound negative for DVT.  I presume this to be a soft tissue strain.  She can ice and use a knee sleeve and update [place] as needed.  I talked to her about the call report on the ultrasound.

## 2019-03-08 ENCOUNTER — Other Ambulatory Visit: Payer: Self-pay

## 2019-03-08 ENCOUNTER — Ambulatory Visit: Payer: BC Managed Care – PPO | Admitting: Primary Care

## 2019-03-08 ENCOUNTER — Encounter: Payer: Self-pay | Admitting: Primary Care

## 2019-03-08 ENCOUNTER — Ambulatory Visit (INDEPENDENT_AMBULATORY_CARE_PROVIDER_SITE_OTHER)
Admission: RE | Admit: 2019-03-08 | Discharge: 2019-03-08 | Disposition: A | Discharge status: Home / Self Care | Payer: BC Managed Care – PPO | Source: Ambulatory Visit | Attending: Primary Care | Admitting: Primary Care

## 2019-03-08 VITALS — BP 122/72 | HR 71 | Temp 97.7°F | Ht 69.0 in

## 2019-03-08 DIAGNOSIS — M79604 Pain in right leg: Secondary | ICD-10-CM | POA: Diagnosis not present

## 2019-03-08 NOTE — Progress Notes (Signed)
Subjective:    Patient ID: Judy Price, female    DOB: 03/09/1961, 58 y.o.   MRN: [id number]  HPI   This visit occurred during the SARS-CoV-2 public health emergency.  Safety protocols were in place, including screening questions prior to the visit, additional usage of staff PPE, and extensive cleaning of exam room while observing appropriate contact time as indicated for disinfecting solutions.    Ms. Kolander is a 58 year old female with a history of chronic neck/ shoulder/rightknee pain, arthralgias of hands, right lower extremity pain who presents today with a chief complaint of extremity pain.  Her pain is located to the right lower extremity behind her right knee only, with pulling sensation to right lateral knee with eversion of her right foot. This began in late November 2020 within a few days after kicking her leg out while playing on the stairs with her grandchildren. She was evaluated by Dr. Damita Dunnings on 02/22/19, underwent evaluation for DVT which was negative.   She denies color changes, swelling, weakness, numbness/tingling. She's unable to walk up stairs due to pain as she cannot put any increased pressure/weight. She doesn't have much discomfort with ambulation on flat surfaces, mild discomfort with walking up inclines. She's been using a knee brace, ice, elevation, some Ibuprofen with some improvement. Overall she's doing better.   Review of Systems  Musculoskeletal: Positive for arthralgias.       Acute right posterior knee and lateral lower extremity pain  Skin: Negative for color change.  Neurological: Negative for weakness and numbness.       Past Medical History:  Diagnosis Date  . Chicken pox   . Eye infection, bilateral 11/2018  . History of UTI   . Iron deficiency anemia    in the past  . Shingles 03/19/2018   also had a bout in september 2020. nothing active now     Social History   Socioeconomic History  . Marital status: Married    Spouse name: joaquin  - husband  . Number of children: Not on file  . Years of education: Not on file  . Highest education level: Not on file  Occupational History  . Occupation: not currently working    Comment: HOMEMAKER  Tobacco Use  . Smoking status: Never Smoker  . Smokeless tobacco: Never Used  Substance and Sexual Activity  . Alcohol use: No    Alcohol/week: 0.0 standard drinks  . Drug use: No  . Sexual activity: Not on file  Other Topics Concern  . Not on file  Social History Narrative   Married.   Moved from [place].   One child, two grandchildren Home maker   Enjoys decorating, Social worker.   Social Determinants of Health   Financial Resource Strain:   . Difficulty of Paying Living Expenses: Not on file  Food Insecurity:   . Worried About Charity fundraiser in the Last Year: Not on file  . Ran Out of Food in the Last Year: Not on file  Transportation Needs:   . Lack of Transportation (Medical): Not on file  . Lack of Transportation (Non-Medical): Not on file  Physical Activity:   . Days of Exercise per Week: Not on file  . Minutes of Exercise per Session: Not on file  Stress:   . Feeling of Stress : Not on file  Social Connections:   . Frequency of Communication with Friends and Family: Not on file  . Frequency of Social Gatherings with Friends and  Family: Not on file  . Attends Religious Services: Not on file  . Active Member of Clubs or Organizations: Not on file  . Attends Banker Meetings: Not on file  . Marital Status: Not on file  Intimate Partner Violence:   . Fear of Current or Ex-Partner: Not on file  . Emotionally Abused: Not on file  . Physically Abused: Not on file  . Sexually Abused: Not on file    Past Surgical History:  Procedure Laterality Date  . ABDOMINAL HYSTERECTOMY  2010  . GASTRIC BYPASS  2008  . SHOULDER ARTHROSCOPY WITH SUBACROMIAL DECOMPRESSION AND OPEN ROTATOR C Right 01/11/2019   Procedure: SHOULDER ARTHROSCOPY WITH  SUBACROMIAL DECOMPRESSION ,OPEN BICEP TENDON REPAIR, MINI OPEN REGENTEN PATCH APPLICATION;  Surgeon: Signa Kell, MD;  Location: [facility] ORS;  Service: Orthopedics;  Laterality: Right;  . TONSILLECTOMY AND ADENOIDECTOMY      Family History  Problem Relation Age of Onset  . Arthritis Mother   . Cancer Mother        Breast tumors  . Hyperlipidemia Mother   . Heart disease Mother   . Stroke Mother   . Hypertension Mother   . Hyperlipidemia Father   . Heart disease Father   . Stroke Father   . Arthritis Maternal Grandmother   . Cancer Maternal Grandmother        Breast  . Hyperlipidemia Maternal Grandmother   . Heart disease Maternal Grandmother   . Stroke Maternal Grandmother   . Hypertension Maternal Grandmother   . Hyperlipidemia Maternal Grandfather   . Hyperlipidemia Paternal Grandmother   . Hyperlipidemia Paternal Grandfather     Allergies  Allergen Reactions  . Codeine Nausea Only    Current Outpatient Medications on File Prior to Visit  Medication Sig Dispense Refill  . acetaminophen (TYLENOL) 500 MG tablet Take 2 tablets (1,000 mg total) by mouth every 8 (eight) hours. 90 tablet 2  . gabapentin (NEURONTIN) 300 MG capsule Take 1 capsule (300 mg total) by mouth at bedtime. 90 capsule 1  . Multiple Vitamin (MULTIVITAMIN WITH MINERALS) TABS tablet Take 1 tablet by mouth daily.    Marland Kitchen PARoxetine (PAXIL-CR) 12.5 MG 24 hr tablet TAKE 1 TABLET DAILY FOR HOT FLASHES 90 tablet 1   No current facility-administered medications on file prior to visit.    BP 122/72   Pulse 71   Temp 97.7 F (36.5 C) (Temporal)   Ht 5\' 9"  (1.753 m)   BMI 27.32 kg/m    Objective:   Physical Exam  Constitutional: She appears well-nourished.  Respiratory: Effort normal.  Musculoskeletal:       Legs:     Comments: 5/5 strength to bilateral lower extremities. No tenderness to patella anterior or posterior. No right lateral lower extremity tenderness.   Skin: Skin is warm and dry. No erythema.            Assessment & Plan:

## 2019-03-08 NOTE — Patient Instructions (Signed)
Complete xray(s) prior to leaving today. I will notify you of your results once received.  Continue to wear the knee brace, ice, elevate, Tylenol or occasional Ibuprofen.   It was a pleasure to see you today!

## 2019-03-08 NOTE — Assessment & Plan Note (Signed)
Continued since late November 2020, overall improved. Reviewed work up per Dr. Damita Dunnings in November 2020, agree that symptoms are likely MSK strain.   Exam today grossly unremarkable. Check plain films of the right knee today.  Continue bracing, ice, elevation, OTC analgesics.  Consider PT vs further imaging if no continued improvement.

## 2019-03-15 ENCOUNTER — Telehealth: Payer: BC Managed Care – PPO | Admitting: Family

## 2019-03-15 DIAGNOSIS — H9202 Otalgia, left ear: Secondary | ICD-10-CM

## 2019-03-15 NOTE — Progress Notes (Signed)
Based on what you shared with me, I feel your condition warrants further evaluation and I recommend that you be seen for a face to face visit.  Please contact your primary care physician practice to be seen. Many offices offer virtual options to be seen via video if you are not comfortable going in person to a medical facility at this time.  We need to take a look at your ear to determine is it is an infection or not. It could also be a ear wax plug. The best way to determine, is to see you in person.   If you do not have a PCP, [facility] offers a free physician referral service available at [phone]. Our trained staff has the experience, knowledge and resources to put you in touch with a physician who is right for you.   You also have the option of a video visit through [email or web address]  If you are having a true medical emergency please call 911.  NOTE: If you entered your credit card information for this eVisit, you will not be charged. You may see a "hold" on your card for the $35 but that hold will drop off and you will not have a charge processed.  Your e-visit answers were reviewed by a board certified advanced clinical practitioner to complete your personal care plan.  Thank you for using e-Visits.

## 2019-03-15 NOTE — Telephone Encounter (Signed)
Patient needs office visit.  

## 2019-04-20 DIAGNOSIS — G2581 Restless legs syndrome: Secondary | ICD-10-CM

## 2019-04-20 MED ORDER — GABAPENTIN 300 MG PO CAPS: 300.0000 mg | ORAL_CAPSULE | Freq: Every day | ORAL | 1 refills | Status: DC

## 2019-04-21 ENCOUNTER — Other Ambulatory Visit: Payer: Self-pay | Admitting: Primary Care

## 2019-04-21 DIAGNOSIS — R232 Flushing: Secondary | ICD-10-CM

## 2019-05-04 ENCOUNTER — Telehealth: Payer: BC Managed Care – PPO | Admitting: Physician Assistant

## 2019-05-04 DIAGNOSIS — J019 Acute sinusitis, unspecified: Secondary | ICD-10-CM | POA: Diagnosis not present

## 2019-05-04 MED ORDER — AMOXICILLIN-POT CLAVULANATE 875-125 MG PO TABS: 1.0000 | ORAL_TABLET | Freq: Two times a day (BID) | ORAL | 0 refills | Status: DC

## 2019-05-04 NOTE — Progress Notes (Signed)

## 2019-07-06 ENCOUNTER — Ambulatory Visit: Payer: BC Managed Care – PPO | Admitting: Dermatology

## 2019-07-14 ENCOUNTER — Ambulatory Visit: Payer: BC Managed Care – PPO | Admitting: Dermatology

## 2019-07-21 ENCOUNTER — Ambulatory Visit: Payer: BC Managed Care – PPO | Admitting: Dermatology

## 2019-08-04 ENCOUNTER — Other Ambulatory Visit: Payer: Self-pay | Admitting: Dermatology

## 2019-08-16 ENCOUNTER — Ambulatory Visit: Payer: BC Managed Care – PPO | Admitting: Dermatology

## 2019-09-17 ENCOUNTER — Ambulatory Visit: Payer: BC Managed Care – PPO | Admitting: Primary Care

## 2019-09-17 ENCOUNTER — Encounter: Payer: Self-pay | Admitting: Primary Care

## 2019-09-17 ENCOUNTER — Other Ambulatory Visit: Payer: Self-pay

## 2019-09-17 VITALS — BP 118/76 | HR 55 | Temp 96.8°F

## 2019-09-17 DIAGNOSIS — D509 Iron deficiency anemia, unspecified: Secondary | ICD-10-CM | POA: Diagnosis not present

## 2019-09-17 DIAGNOSIS — Z1152 Encounter for screening for COVID-19: Secondary | ICD-10-CM

## 2019-09-17 DIAGNOSIS — R7303 Prediabetes: Secondary | ICD-10-CM | POA: Diagnosis not present

## 2019-09-17 DIAGNOSIS — D72819 Decreased white blood cell count, unspecified: Secondary | ICD-10-CM

## 2019-09-17 DIAGNOSIS — G2581 Restless legs syndrome: Secondary | ICD-10-CM | POA: Diagnosis not present

## 2019-09-17 DIAGNOSIS — Z9884 Bariatric surgery status: Secondary | ICD-10-CM | POA: Insufficient documentation

## 2019-09-17 DIAGNOSIS — R232 Flushing: Secondary | ICD-10-CM

## 2019-09-17 DIAGNOSIS — Z1231 Encounter for screening mammogram for malignant neoplasm of breast: Secondary | ICD-10-CM

## 2019-09-17 LAB — COMPREHENSIVE METABOLIC PANEL
ALT: 23 U/L (ref 0–35)
AST: 19 U/L (ref 0–37)
Albumin: 4.2 g/dL (ref 3.5–5.2)
Alkaline Phosphatase: 56 U/L (ref 39–117)
BUN: 20 mg/dL (ref 6–23)
CO2: 30 mEq/L (ref 19–32)
Calcium: 9.7 mg/dL (ref 8.4–10.5)
Chloride: 107 mEq/L (ref 96–112)
Creatinine, Ser: 0.82 mg/dL (ref 0.40–1.20)
GFR: 70.97 mL/min (ref >60.00)
Glucose, Bld: 86 mg/dL (ref 70–99)
Potassium: 5.2 mEq/L — ABNORMAL HIGH (ref 3.5–5.1)
Sodium: 144 mEq/L (ref 135–145)
Total Bilirubin: 0.4 mg/dL (ref 0.2–1.2)
Total Protein: 6.7 g/dL (ref 6.0–8.3)

## 2019-09-17 LAB — LIPID PANEL
Cholesterol: 189 mg/dL (ref 0–200)
HDL: 70.6 mg/dL (ref >39.00)
LDL Cholesterol: 110 mg/dL — ABNORMAL HIGH (ref 0–99)
NonHDL: 118.32
Total CHOL/HDL Ratio: 3
Triglycerides: 44 mg/dL (ref 0.0–149.0)
VLDL: 8.8 mg/dL (ref 0.0–40.0)

## 2019-09-17 LAB — SARS-COV-2 IGG: SARS-COV-2 IgG: 0.04

## 2019-09-17 LAB — CBC
HCT: 34.7 % — ABNORMAL LOW (ref 36.0–46.0)
Hemoglobin: 11.3 g/dL — ABNORMAL LOW (ref 12.0–15.0)
MCHC: 32.6 g/dL (ref 30.0–36.0)
MCV: 77.6 fl — ABNORMAL LOW (ref 78.0–100.0)
Platelets: 201 10*3/uL (ref 150.0–400.0)
RBC: 4.47 Mil/uL (ref 3.87–5.11)
RDW: 14.3 % (ref 11.5–15.5)
WBC: 3.4 10*3/uL — ABNORMAL LOW (ref 4.0–10.5)

## 2019-09-17 LAB — POCT GLYCOSYLATED HEMOGLOBIN (HGB A1C): Hemoglobin A1C: 5.6 % (ref 4.0–5.6)

## 2019-09-17 LAB — VITAMIN B12: Vitamin B-12: 834 pg/mL (ref 211–911)

## 2019-09-17 LAB — VITAMIN D 25 HYDROXY (VIT D DEFICIENCY, FRACTURES): VITD: 43.55 ng/mL (ref 30.00–100.00)

## 2019-09-17 LAB — TSH: TSH: 1.34 u[IU]/mL (ref 0.35–4.50)

## 2019-09-17 MED ORDER — GABAPENTIN 300 MG PO CAPS: 300.0000 mg | ORAL_CAPSULE | Freq: Every day | ORAL | 3 refills | Status: DC

## 2019-09-17 MED ORDER — PAROXETINE HCL ER 12.5 MG PO TB24: ORAL_TABLET | ORAL | 3 refills | Status: DC

## 2019-09-17 NOTE — Patient Instructions (Signed)
Stop by the lab prior to leaving today. I will notify you of your results once received.   Call the breast center to schedule your mammogram.  Continue to work on a healthy diet, you look great!  It was a pleasure to see you today!

## 2019-09-17 NOTE — Assessment & Plan Note (Signed)
History of gastric bypass, repeat CBC pending.

## 2019-09-17 NOTE — Assessment & Plan Note (Signed)
Repeat labs pending including vitamins, B12, CBC, CMP.

## 2019-09-17 NOTE — Assessment & Plan Note (Signed)
Doing well on paroxetine CR 12.5 mg daily, continue same. Refills sent to pharmacy.

## 2019-09-17 NOTE — Assessment & Plan Note (Signed)
A1C within normal range of 5.6. commended her on weight loss.   Her A1C today doesn't really suggest a lot of hypoglycemic episodes, discussed to monitor symptoms when episodes do occur.  Overall her episodes improve quickly with eating, given her bypass surgery we discussed small, frequent meals.   She will update if symptoms persist.

## 2019-09-17 NOTE — Assessment & Plan Note (Signed)
Well controlled on gabapentin HS, continue same.  Refills sent to pharmacy.

## 2019-09-17 NOTE — Progress Notes (Signed)
Subjective:    Patient ID: Judy Price, female    DOB: [birth year], 59 y.o.   MRN: [id number]  HPI  This visit occurred during the SARS-CoV-2 public health emergency.  Safety protocols were in place, including screening questions prior to the visit, additional usage of staff PPE, and extensive cleaning of exam room while observing appropriate contact time as indicated for disinfecting solutions.   Judy Price is a 59 year old female with a history of anemia, osteoarthritis, prediabetes, restless legs who presents today for follow up and a chief complaint of hypoglycemia. She would also like to be screened for Covid antibodies. She is also overdue for screening mammogram.  1) Restless Legs: Currently managed on gabapentin 300 mg for which she takes nightly. Doing very well and denies lower extremity pain.   2) Vasomotor symptoms: Currently managed on paroxetine CR 12.5 mg daily. Overall doing very well on this regimen.   3) Prediabetes: A1C of 5.9 in March 2020. A1C today of 5.6. She does have drops in glucose that will reduce to the 50's, along with symptoms of feeling jittery/weak. This occurs once to twice monthly on average. She checks her blood sugar on her husbands meter during these episodes and confirms readings in the 50's.  She will eat protein and crackers during these episodes and will improve quickly.   She does have a history of gastric bypass, denies not eating on days during drops.   BP Readings from Last 3 Encounters:  09/17/19 118/76  03/08/19 122/72  02/22/19 130/80     Review of Systems  Eyes: Negative for visual disturbance.  Respiratory: Negative for shortness of breath.   Cardiovascular: Negative for chest pain.  Neurological: Negative for headaches.       Past Medical History:  Diagnosis Date  . Chicken pox   . Eye infection, bilateral 11/2018  . History of UTI   . Iron deficiency anemia    in the past  . Shingles 03/19/2018   also had a bout in  september 2020. nothing active now     Social History   Socioeconomic History  . Marital status: Married    Spouse name: joaquin - husband  . Number of children: Not on file  . Years of education: Not on file  . Highest education level: Not on file  Occupational History  . Occupation: not currently working    Comment: HOMEMAKER  Tobacco Use  . Smoking status: Never Smoker  . Smokeless tobacco: Never Used  Vaping Use  . Vaping Use: Never used  Substance and Sexual Activity  . Alcohol use: No    Alcohol/week: 0.0 standard drinks  . Drug use: No  . Sexual activity: Not on file  Other Topics Concern  . Not on file  Social History Narrative   Married.   Moved from [place].   One child, two grandchildren Home maker   Enjoys decorating, Social worker.   Social Determinants of Health   Financial Resource Strain:   . Difficulty of Paying Living Expenses:   Food Insecurity:   . Worried About Charity fundraiser in the Last Year:   . Arboriculturist in the Last Year:   Transportation Needs:   . Film/video editor (Medical):   Marland Kitchen Lack of Transportation (Non-Medical):   Physical Activity:   . Days of Exercise per Week:   . Minutes of Exercise per Session:   Stress:   . Feeling of Stress :   Social  Connections:   . Frequency of Communication with Friends and Family:   . Frequency of Social Gatherings with Friends and Family:   . Attends Religious Services:   . Active Member of Clubs or Organizations:   . Attends Banker Meetings:   Marland Kitchen Marital Status:   Intimate Partner Violence:   . Fear of Current or Ex-Partner:   . Emotionally Abused:   Marland Kitchen Physically Abused:   . Sexually Abused:     Past Surgical History:  Procedure Laterality Date  . ABDOMINAL HYSTERECTOMY  2010  . GASTRIC BYPASS  2008  . SHOULDER ARTHROSCOPY WITH SUBACROMIAL DECOMPRESSION AND OPEN ROTATOR C Right 01/11/2019   Procedure: SHOULDER ARTHROSCOPY WITH SUBACROMIAL DECOMPRESSION ,OPEN  BICEP TENDON REPAIR, MINI OPEN REGENTEN PATCH APPLICATION;  Surgeon: Signa Kell, MD;  Location: [facility] ORS;  Service: Orthopedics;  Laterality: Right;  . TONSILLECTOMY AND ADENOIDECTOMY      Family History  Problem Relation Age of Onset  . Arthritis Mother   . Cancer Mother        Breast tumors  . Hyperlipidemia Mother   . Heart disease Mother   . Stroke Mother   . Hypertension Mother   . Hyperlipidemia Father   . Heart disease Father   . Stroke Father   . Arthritis Maternal Grandmother   . Cancer Maternal Grandmother        Breast  . Hyperlipidemia Maternal Grandmother   . Heart disease Maternal Grandmother   . Stroke Maternal Grandmother   . Hypertension Maternal Grandmother   . Hyperlipidemia Maternal Grandfather   . Hyperlipidemia Paternal Grandmother   . Hyperlipidemia Paternal Grandfather     Allergies  Allergen Reactions  . Codeine Nausea Only    Current Outpatient Medications on File Prior to Visit  Medication Sig Dispense Refill  . Multiple Vitamin (MULTIVITAMIN WITH MINERALS) TABS tablet Take 1 tablet by mouth daily.     No current facility-administered medications on file prior to visit.    BP 118/76 (BP Location: Left Arm, Patient Position: Sitting, Cuff Size: Normal)   Pulse (!) 55   Temp (!) 96.8 F (36 C) (Temporal)   SpO2 97%    Objective:   Physical Exam  Cardiovascular: Normal rate and regular rhythm.  Respiratory: Effort normal and breath sounds normal.  Musculoskeletal:     Cervical back: Neck supple.  Skin: Skin is warm and dry.  Psychiatric: Mood normal.           Assessment & Plan:

## 2019-09-22 ENCOUNTER — Encounter: Payer: Self-pay | Admitting: Oncology

## 2019-09-22 NOTE — Progress Notes (Signed)
Patient contacted for New patient visit. Chart reviewed and updated with patient. Directions to cancer center given.

## 2019-09-23 ENCOUNTER — Inpatient Hospital Stay: Payer: BC Managed Care – PPO

## 2019-09-23 ENCOUNTER — Inpatient Hospital Stay: Payer: BC Managed Care – PPO | Attending: Oncology | Admitting: Oncology

## 2019-09-23 ENCOUNTER — Encounter: Payer: Self-pay | Admitting: Oncology

## 2019-09-23 ENCOUNTER — Other Ambulatory Visit: Payer: Self-pay

## 2019-09-23 VITALS — BP 123/77 | HR 66 | Temp 97.1°F | Resp 18 | Ht 69.0 in | Wt 188.0 lb

## 2019-09-23 DIAGNOSIS — D72818 Other decreased white blood cell count: Secondary | ICD-10-CM

## 2019-09-23 DIAGNOSIS — D72819 Decreased white blood cell count, unspecified: Secondary | ICD-10-CM | POA: Diagnosis present

## 2019-09-23 DIAGNOSIS — Z79899 Other long term (current) drug therapy: Secondary | ICD-10-CM | POA: Insufficient documentation

## 2019-09-23 DIAGNOSIS — D509 Iron deficiency anemia, unspecified: Secondary | ICD-10-CM

## 2019-09-23 DIAGNOSIS — D696 Thrombocytopenia, unspecified: Secondary | ICD-10-CM | POA: Insufficient documentation

## 2019-09-23 LAB — CBC WITH DIFFERENTIAL/PLATELET
Abs Immature Granulocytes: 0.01 10*3/uL (ref 0.00–0.07)
Basophils Absolute: 0 10*3/uL (ref 0.0–0.1)
Basophils Relative: 1 %
Eosinophils Absolute: 0 10*3/uL (ref 0.0–0.5)
Eosinophils Relative: 1 %
HCT: 35.8 % — ABNORMAL LOW (ref 36.0–46.0)
Hemoglobin: 11.2 g/dL — ABNORMAL LOW (ref 12.0–15.0)
Immature Granulocytes: 0 %
Lymphocytes Relative: 32 %
Lymphs Abs: 1.1 10*3/uL (ref 0.7–4.0)
MCH: 25.1 pg — ABNORMAL LOW (ref 26.0–34.0)
MCHC: 31.3 g/dL (ref 30.0–36.0)
MCV: 80.1 fL (ref 80.0–100.0)
Monocytes Absolute: 0.3 10*3/uL (ref 0.1–1.0)
Monocytes Relative: 9 %
Neutro Abs: 2 10*3/uL (ref 1.7–7.7)
Neutrophils Relative %: 57 %
Platelets: 222 10*3/uL (ref 150–400)
RBC: 4.47 MIL/uL (ref 3.87–5.11)
RDW: 13.8 % (ref 11.5–15.5)
WBC: 3.4 10*3/uL — ABNORMAL LOW (ref 4.0–10.5)
nRBC: 0 % (ref 0.0–0.2)

## 2019-09-23 LAB — FERRITIN: Ferritin: 4 ng/mL — ABNORMAL LOW (ref 11–307)

## 2019-09-23 LAB — IRON AND TIBC
Iron: 55 ug/dL (ref 28–170)
Saturation Ratios: 11 % (ref 10.4–31.8)
TIBC: 511 ug/dL — ABNORMAL HIGH (ref 250–450)
UIBC: 456 ug/dL

## 2019-09-23 LAB — URIC ACID: Uric Acid, Serum: 3.4 mg/dL (ref 2.5–7.1)

## 2019-09-23 LAB — HEPATITIS PANEL, ACUTE
HCV Ab: NONREACTIVE
Hep A IgM: NONREACTIVE
Hep B C IgM: NONREACTIVE
Hepatitis B Surface Ag: NONREACTIVE

## 2019-09-23 LAB — HIV ANTIBODY (ROUTINE TESTING W REFLEX): HIV Screen 4th Generation wRfx: NONREACTIVE

## 2019-09-23 LAB — LACTATE DEHYDROGENASE: LDH: 106 U/L (ref 98–192)

## 2019-09-23 LAB — FOLATE: Folate: 31 ng/mL (ref >5.9)

## 2019-09-23 NOTE — Progress Notes (Signed)
Hematology/Oncology Consult note [facility] [phone] [phone]   Patient Care Team: Pleas Koch, NP as PCP - General (Nurse Practitioner)  REFERRING PROVIDER: Pleas Koch, NP  CHIEF COMPLAINTS/REASON FOR VISIT:  Evaluation of leukopenia  HISTORY OF PRESENTING ILLNESS:  Judy Price is a 59 y.o. female who was seen in consultation at the request of Pleas Koch, NP for evaluation of leukopenia. Reviewed patient's recent labs. Patient has low total WBC count was 3.4, no differentiation was done. Previous lab records reviewed. Leukopenia duration is chronic onset, duration is since at least July 2020. Patient informs me that she moved to [place] 3 years ago.  Prior to her move, she recalls having mild no total white blood cell count as well.  Associated symptoms +fatigue, denies weight loss, fever, chills, frequent infection.  History hepatitis or HIV infection: Denies History of chronic liver disease: Denies History of blood transfusion: Denies Alcohol consumption: Denies Diet Vegetarian or Vegan: Denies Herbal medication: Denies    Review of Systems  Constitutional: Negative for appetite change, chills and fever.  HENT:   Negative for hearing loss and voice change.   Eyes: Negative for eye problems.  Respiratory: Negative for chest tightness and cough.   Cardiovascular: Negative for chest pain.  Gastrointestinal: Negative for abdominal distention, abdominal pain and blood in stool.  Endocrine: Negative for hot flashes.  Genitourinary: Negative for difficulty urinating and frequency.   Musculoskeletal: Negative for arthralgias.  Skin: Negative for itching and rash.  Neurological: Negative for extremity weakness.  Hematological: Negative for adenopathy.  Psychiatric/Behavioral: Negative for confusion.    MEDICAL HISTORY:  Past Medical History:  Diagnosis Date  . Allergic conjunctivitis 06/24/2018    . Chicken pox   . Eye infection, bilateral 11/2018  . History of UTI   . Iron deficiency anemia    in the past  . Shingles 03/19/2018   also had a bout in september 2020. nothing active now    SURGICAL HISTORY: Past Surgical History:  Procedure Laterality Date  . ABDOMINAL HYSTERECTOMY  2010  . GASTRIC BYPASS  2008  . SHOULDER ARTHROSCOPY WITH SUBACROMIAL DECOMPRESSION AND OPEN ROTATOR C Right 01/11/2019   Procedure: SHOULDER ARTHROSCOPY WITH SUBACROMIAL DECOMPRESSION ,OPEN BICEP TENDON REPAIR, MINI OPEN REGENTEN PATCH APPLICATION;  Surgeon: Leim Fabry, MD;  Location: [facility] ORS;  Service: Orthopedics;  Laterality: Right;  . TONSILLECTOMY AND ADENOIDECTOMY      SOCIAL HISTORY: Social History   Socioeconomic History  . Marital status: Married    Spouse name: joaquin - husband  . Number of children: Not on file  . Years of education: Not on file  . Highest education level: Not on file  Occupational History  . Occupation: not currently working    Comment: HOMEMAKER  Tobacco Use  . Smoking status: Never Smoker  . Smokeless tobacco: Never Used  Vaping Use  . Vaping Use: Never used  Substance and Sexual Activity  . Alcohol use: No    Alcohol/week: 0.0 standard drinks  . Drug use: No  . Sexual activity: Not on file  Other Topics Concern  . Not on file  Social History Narrative   Married.   Moved from [place].   One child, two grandchildren Home maker   Enjoys decorating, Social worker.   Social Determinants of Health   Financial Resource Strain:   . Difficulty of Paying Living Expenses:   Food Insecurity:   . Worried About Charity fundraiser in the Last  Year:   . Ran Out of Food in the Last Year:   Transportation Needs:   . Film/video editor (Medical):   Marland Kitchen Lack of Transportation (Non-Medical):   Physical Activity:   . Days of Exercise per Week:   . Minutes of Exercise per Session:   Stress:   . Feeling of Stress :   Social Connections:   .  Frequency of Communication with Friends and Family:   . Frequency of Social Gatherings with Friends and Family:   . Attends Religious Services:   . Active Member of Clubs or Organizations:   . Attends Archivist Meetings:   Marland Kitchen Marital Status:   Intimate Partner Violence:   . Fear of Current or Ex-Partner:   . Emotionally Abused:   Marland Kitchen Physically Abused:   . Sexually Abused:     FAMILY HISTORY: Family History  Problem Relation Age of Onset  . Arthritis Mother   . Cancer Mother        Breast tumors  . Hyperlipidemia Mother   . Heart disease Mother   . Stroke Mother   . Hypertension Mother   . Heart attack Mother   . Hyperlipidemia Father   . Heart disease Father   . Stroke Father   . Heart attack Father   . Arthritis Maternal Grandmother   . Cancer Maternal Grandmother        Breast  . Hyperlipidemia Maternal Grandmother   . Heart disease Maternal Grandmother   . Stroke Maternal Grandmother   . Hypertension Maternal Grandmother   . Hyperlipidemia Maternal Grandfather   . Multiple myeloma Maternal Grandfather   . Hyperlipidemia Paternal Grandmother   . Hyperlipidemia Paternal Grandfather   . Uterine cancer Sister     ALLERGIES:  is allergic to codeine.  MEDICATIONS:  Current Outpatient Medications  Medication Sig Dispense Refill  . bimatoprost (LATISSE) 0.03 % ophthalmic solution APPLY ONE DROP TO EYELIDS AT BEDTIME    . gabapentin (NEURONTIN) 300 MG capsule Take 1 capsule (300 mg total) by mouth at bedtime. For restless legs. 90 capsule 3  . Multiple Vitamin (MULTIVITAMIN WITH MINERALS) TABS tablet Take 1 tablet by mouth daily.    Marland Kitchen PARoxetine (PAXIL-CR) 12.5 MG 24 hr tablet TAKE 1 TABLET BY MOUTH ONCE DAILY FOR HOT FLASHES 90 tablet 3   No current facility-administered medications for this visit.     PHYSICAL EXAMINATION: ECOG PERFORMANCE STATUS: 0 - Asymptomatic Vitals:   09/23/19 0928  BP: 123/77  Pulse: 66  Resp: 18  Temp: (!) 97.1 F (36.2 C)     Filed Weights   09/23/19 0928  Weight: 188 lb (85.3 kg)    Physical Exam Constitutional:      General: She is not in acute distress. HENT:     Head: Normocephalic and atraumatic.  Eyes:     General: No scleral icterus. Cardiovascular:     Rate and Rhythm: Normal rate and regular rhythm.     Heart sounds: Normal heart sounds.  Pulmonary:     Effort: Pulmonary effort is normal. No respiratory distress.     Breath sounds: No wheezing.  Abdominal:     General: Bowel sounds are normal. There is no distension.     Palpations: Abdomen is soft.  Musculoskeletal:        General: No deformity. Normal range of motion.     Cervical back: Normal range of motion and neck supple.  Skin:    General: Skin is warm and dry.  Findings: No erythema or rash.  Neurological:     Mental Status: She is alert and oriented to person, place, and time. Mental status is at baseline.     Cranial Nerves: No cranial nerve deficit.     Coordination: Coordination normal.  Psychiatric:        Mood and Affect: Mood normal.     RADIOGRAPHIC STUDIES: I have personally reviewed the radiological images as listed and agreed with the findings in the report.  CMP Latest Ref Rng & Units 09/17/2019  Glucose 70 - 99 mg/dL 86  BUN 6 - 23 mg/dL 20  Creatinine 0.40 - 1.20 mg/dL 0.82  Sodium 135 - 145 mEq/L 144  Potassium 3.5 - 5.1 mEq/L 5.2(H)  Chloride 96 - 112 mEq/L 107  CO2 19 - 32 mEq/L 30  Calcium 8.4 - 10.5 mg/dL 9.7  Total Protein 6.0 - 8.3 g/dL 6.7  Total Bilirubin 0.2 - 1.2 mg/dL 0.4  Alkaline Phos 39 - 117 U/L 56  AST 0 - 37 U/L 19  ALT 0 - 35 U/L 23   CBC Latest Ref Rng & Units 09/23/2019  WBC 4.0 - 10.5 K/uL 3.4(L)  Hemoglobin 12.0 - 15.0 g/dL 11.2(L)  Hematocrit 36 - 46 % 35.8(L)  Platelets 150 - 400 K/uL 222    LABORATORY DATA:  I have reviewed the data as listed Lab Results  Component Value Date   WBC 3.4 (L) 09/23/2019   HGB 11.2 (L) 09/23/2019   HCT 35.8 (L) 09/23/2019   MCV 80.1  09/23/2019   PLT 222 09/23/2019   Recent Labs    09/17/19 1023  NA 144  K 5.2*  CL 107  CO2 30  GLUCOSE 86  BUN 20  CREATININE 0.82  CALCIUM 9.7  PROT 6.7  ALBUMIN 4.2  AST 19  ALT 23  ALKPHOS 56  BILITOT 0.4   Iron/TIBC/Ferritin/ %Sat    Component Value Date/Time   IRON 38 (L) 01/03/2016 1559   IRONPCTSAT 11.8 (L) 01/03/2016 1559     RADIOGRAPHIC STUDIES: I have personally reviewed the radiological images as listed and agreed with the findings in the report. No results found.    ASSESSMENT & PLAN:  1. Leukopenia, unspecified type   2. Microcytic anemia     I discussed with patient that the differential diagnosis of the thrombosis is broad, including infection, inflammation, nutrition deficiency, malignant etiology including underlying bone morrow disorders.  For the work up of patient's thrombocytopenia, I recommend checking CBC;CMP, LDH; smear review, folate, , hepatitis, HIV, ANA,  Flowcytometry, zinc and copper.    Microcytic anemia, check iron panel.   # Patient follow-up with me in approximately 2 weeks to review the above results.   Orders Placed This Encounter  Procedures  . CBC with Differential/Platelet    Standing Status:   Future    Number of Occurrences:   1    Standing Expiration Date:   09/22/2020  . Folate    Standing Status:   Future    Number of Occurrences:   1    Standing Expiration Date:   09/22/2020  . Flow cytometry panel-leukemia/lymphoma work-up    Standing Status:   Future    Number of Occurrences:   1    Standing Expiration Date:   09/22/2020  . ANA, IFA (with reflex)    Standing Status:   Future    Number of Occurrences:   1    Standing Expiration Date:   09/22/2020  . HIV Antibody (  routine testing w rflx)    Standing Status:   Future    Number of Occurrences:   1    Standing Expiration Date:   09/22/2020  . Hepatitis panel, acute    Standing Status:   Future    Number of Occurrences:   1    Standing Expiration Date:   09/22/2020    . Lactate dehydrogenase    Standing Status:   Future    Number of Occurrences:   1    Standing Expiration Date:   09/22/2020  . Uric acid    Standing Status:   Future    Number of Occurrences:   1    Standing Expiration Date:   09/22/2020  . Copper, serum    Standing Status:   Future    Number of Occurrences:   1    Standing Expiration Date:   09/22/2020  . Zinc    Standing Status:   Future    Number of Occurrences:   1    Standing Expiration Date:   09/22/2020    All questions were answered. The patient knows to call the clinic with any problems questions or concerns.  Cc Pleas Koch, NP  Return of visit: 2 weeks Thank you for this kind referral and the opportunity to participate in the care of this patient. A copy of today's note is routed to referring provider      Earlie Server, MD, PhD Hematology Oncology [facility] Pager- [phone] 09/23/2019

## 2019-09-24 LAB — ANTINUCLEAR ANTIBODIES, IFA: ANA Ab, IFA: NEGATIVE

## 2019-09-24 MED ORDER — FERROUS SULFATE 325 (65 FE) MG PO TBEC
325.0000 mg | DELAYED_RELEASE_TABLET | Freq: Two times a day (BID) | ORAL | 1 refills | Status: DC
Start: 2019-09-24 — End: 2020-11-01

## 2019-09-26 LAB — ZINC: Zinc: 77 ug/dL (ref 44–115)

## 2019-09-26 LAB — COPPER, SERUM: Copper: 117 ug/dL (ref 80–158)

## 2019-09-29 LAB — COMP PANEL: LEUKEMIA/LYMPHOMA

## 2019-10-07 ENCOUNTER — Ambulatory Visit: Payer: BC Managed Care – PPO | Admitting: Primary Care

## 2019-10-07 ENCOUNTER — Telehealth: Payer: Self-pay

## 2019-10-07 NOTE — Telephone Encounter (Signed)
[facility] Night - Client Nonclinical Telephone Record AccessNurse Client [facility] Night - Client Client Site [facility] - Night Physician Vernona Rieger - NP Contact Type Call Who Is Calling Patient / Member / Family / Caregiver Caller Name Judy Price Caller Phone Number [phone] Patient Name Judy Price Patient DOB 1961/01/12 Call Type Message Only Information Provided Reason for Call Request to [facility] Appointment Initial Comment Caller needs to cancel her appt for tomorrow at 830. Disp. Time Disposition Final User 10/06/2019 7:35:00 PM General Information Provided Yes Mayra Reel Call Closed By: Mayra Reel Transaction Date/Time: 10/06/2019 7:32:52 PM (ET)

## 2019-10-08 ENCOUNTER — Inpatient Hospital Stay (HOSPITAL_BASED_OUTPATIENT_CLINIC_OR_DEPARTMENT_OTHER): Payer: BC Managed Care – PPO | Admitting: Oncology

## 2019-10-08 ENCOUNTER — Encounter: Payer: Self-pay | Admitting: Oncology

## 2019-10-08 DIAGNOSIS — D508 Other iron deficiency anemias: Secondary | ICD-10-CM | POA: Diagnosis not present

## 2019-10-08 DIAGNOSIS — D72819 Decreased white blood cell count, unspecified: Secondary | ICD-10-CM | POA: Diagnosis not present

## 2019-10-08 DIAGNOSIS — Z9884 Bariatric surgery status: Secondary | ICD-10-CM | POA: Diagnosis not present

## 2019-10-08 DIAGNOSIS — D649 Anemia, unspecified: Secondary | ICD-10-CM | POA: Insufficient documentation

## 2019-10-08 HISTORY — DX: Anemia, unspecified: D64.9

## 2019-10-08 NOTE — Progress Notes (Signed)
HEMATOLOGY-ONCOLOGY TeleHEALTH VISIT PROGRESS NOTE  I connected with Judy Price on 10/08/19 at  2:15 PM EDT by video enabled telemedicine visit and verified that I am speaking with the correct person using two identifiers. I discussed the limitations, risks, security and privacy concerns of performing an evaluation and management service by telemedicine and the availability of in-person appointments. I also discussed with the patient that there may be a patient responsible charge related to this service. The patient expressed understanding and agreed to proceed.   Other persons participating in the visit and their role in the encounter:  None  Patient's location: Home  Provider's location: office Chief Complaint: Follow-up for anemia and leukopenia   INTERVAL HISTORY Judy Price is a 59 y.o. female who has above history reviewed by me today presents for follow up visit for anemia and leukopenia Problems and complaints are listed below:  Patient has no new concerns, she has had labs done and presents virtually to discuss results and management plan Review of Systems  Constitutional: Negative for appetite change, chills, fatigue and fever.  HENT:   Negative for hearing loss and voice change.   Eyes: Negative for eye problems.  Respiratory: Negative for chest tightness and cough.   Cardiovascular: Negative for chest pain.  Gastrointestinal: Negative for abdominal distention, abdominal pain and blood in stool.  Endocrine: Negative for hot flashes.  Genitourinary: Negative for difficulty urinating and frequency.   Musculoskeletal: Negative for arthralgias.  Skin: Negative for itching and rash.  Neurological: Negative for extremity weakness.  Hematological: Negative for adenopathy.  Psychiatric/Behavioral: Negative for confusion.    Past Medical History:  Diagnosis Date  . Allergic conjunctivitis 06/24/2018  . Chicken pox   . Eye infection, bilateral 11/2018  . History of UTI   . Iron  deficiency anemia    in the past  . Shingles 03/19/2018   also had a bout in september 2020. nothing active now   Past Surgical History:  Procedure Laterality Date  . ABDOMINAL HYSTERECTOMY  2010  . GASTRIC BYPASS  2008  . SHOULDER ARTHROSCOPY WITH SUBACROMIAL DECOMPRESSION AND OPEN ROTATOR C Right 01/11/2019   Procedure: SHOULDER ARTHROSCOPY WITH SUBACROMIAL DECOMPRESSION ,OPEN BICEP TENDON REPAIR, MINI OPEN REGENTEN PATCH APPLICATION;  Surgeon: Leim Fabry, MD;  Location: [facility] ORS;  Service: Orthopedics;  Laterality: Right;  . TONSILLECTOMY AND ADENOIDECTOMY      Family History  Problem Relation Age of Onset  . Arthritis Mother   . Cancer Mother        Breast tumors  . Hyperlipidemia Mother   . Heart disease Mother   . Stroke Mother   . Hypertension Mother   . Heart attack Mother   . Hyperlipidemia Father   . Heart disease Father   . Stroke Father   . Heart attack Father   . Arthritis Maternal Grandmother   . Cancer Maternal Grandmother        Breast  . Hyperlipidemia Maternal Grandmother   . Heart disease Maternal Grandmother   . Stroke Maternal Grandmother   . Hypertension Maternal Grandmother   . Hyperlipidemia Maternal Grandfather   . Multiple myeloma Maternal Grandfather   . Hyperlipidemia Paternal Grandmother   . Hyperlipidemia Paternal Grandfather   . Uterine cancer Sister     Social History   Socioeconomic History  . Marital status: Married    Spouse name: joaquin - husband  . Number of children: Not on file  . Years of education: Not on file  . Highest education level:  Not on file  Occupational History  . Occupation: not currently working    Comment: HOMEMAKER  Tobacco Use  . Smoking status: Never Smoker  . Smokeless tobacco: Never Used  Vaping Use  . Vaping Use: Never used  Substance and Sexual Activity  . Alcohol use: No    Alcohol/week: 0.0 standard drinks  . Drug use: No  . Sexual activity: Not on file  Other Topics Concern  . Not on file   Social History Narrative   Married.   Moved from [place].   One child, two grandchildren Home maker   Enjoys decorating, Social worker.   Social Determinants of Health   Financial Resource Strain:   . Difficulty of Paying Living Expenses:   Food Insecurity:   . Worried About Charity fundraiser in the Last Year:   . Arboriculturist in the Last Year:   Transportation Needs:   . Film/video editor (Medical):   Marland Kitchen Lack of Transportation (Non-Medical):   Physical Activity:   . Days of Exercise per Week:   . Minutes of Exercise per Session:   Stress:   . Feeling of Stress :   Social Connections:   . Frequency of Communication with Friends and Family:   . Frequency of Social Gatherings with Friends and Family:   . Attends Religious Services:   . Active Member of Clubs or Organizations:   . Attends Archivist Meetings:   Marland Kitchen Marital Status:   Intimate Partner Violence:   . Fear of Current or Ex-Partner:   . Emotionally Abused:   Marland Kitchen Physically Abused:   . Sexually Abused:     Current Outpatient Medications on File Prior to Visit  Medication Sig Dispense Refill  . bimatoprost (LATISSE) 0.03 % ophthalmic solution APPLY ONE DROP TO EYELIDS AT BEDTIME    . ferrous sulfate 325 (65 FE) MG EC tablet Take 1 tablet (325 mg total) by mouth in the morning and at bedtime. 60 tablet 1  . gabapentin (NEURONTIN) 300 MG capsule Take 1 capsule (300 mg total) by mouth at bedtime. For restless legs. 90 capsule 3  . Multiple Vitamin (MULTIVITAMIN WITH MINERALS) TABS tablet Take 1 tablet by mouth daily.    Marland Kitchen PARoxetine (PAXIL-CR) 12.5 MG 24 hr tablet TAKE 1 TABLET BY MOUTH ONCE DAILY FOR HOT FLASHES 90 tablet 3   No current facility-administered medications on file prior to visit.    Allergies  Allergen Reactions  . Codeine Nausea Only       Observations/Objective: There were no vitals filed for this visit. There is no height or weight on file to calculate BMI.  Physical  Exam Neurological:     Mental Status: She is alert.     CBC    Component Value Date/Time   WBC 3.4 (L) 09/23/2019 1005   RBC 4.47 09/23/2019 1005   HGB 11.2 (L) 09/23/2019 1005   HCT 35.8 (L) 09/23/2019 1005   PLT 222 09/23/2019 1005   MCV 80.1 09/23/2019 1005   MCH 25.1 (L) 09/23/2019 1005   MCHC 31.3 09/23/2019 1005   RDW 13.8 09/23/2019 1005   LYMPHSABS 1.1 09/23/2019 1005   MONOABS 0.3 09/23/2019 1005   EOSABS 0.0 09/23/2019 1005   BASOSABS 0.0 09/23/2019 1005    CMP     Component Value Date/Time   NA 144 09/17/2019 1023   K 5.2 (H) 09/17/2019 1023   CL 107 09/17/2019 1023   CO2 30 09/17/2019 1023   GLUCOSE  86 09/17/2019 1023   BUN 20 09/17/2019 1023   CREATININE 0.82 09/17/2019 1023   CALCIUM 9.7 09/17/2019 1023   PROT 6.7 09/17/2019 1023   ALBUMIN 4.2 09/17/2019 1023   AST 19 09/17/2019 1023   ALT 23 09/17/2019 1023   ALKPHOS 56 09/17/2019 1023   BILITOT 0.4 09/17/2019 1023     Assessment and Plan: 1. Leukopenia, unspecified type   2. Other iron deficiency anemia     Labs are reviewed and discussed with patient. Iron deficiency anemia in the context of gastric bypass. Patient has failed oral iron supplementation likely due to malabsorption. Plan IV iron with Venofer 239m weekly x 3 doses. Allergy reactions/infusion reaction including anaphylactic reaction discussed with patient. Other side effects include but not limited to high blood pressure, skin rash, weight gain, leg swelling, etc. Patient voices understanding and willing to proceed.  Leukopenia, very mild.  With normal differential.  ANA is negative, normal folate, uric acid, LDH, hepatitis, HIV, copper, zinc levels Follow Up Instructions: 3 months.    I discussed the assessment and treatment plan with the patient. The patient was provided an opportunity to ask questions and all were answered. The patient agreed with the plan and demonstrated an understanding of the instructions.  The patient was  advised to call back or seek an in-person evaluation if the symptoms worsen or if the condition fails to improve as anticipated.    ZEarlie Server MD 10/08/2019 3:03 PM

## 2019-10-12 NOTE — Telephone Encounter (Signed)
Patient called again wondering if she can get excused from going to [facility] Thursday. She has severe anxiety about going and driving all the way there and she has to have a doctors note in order to get excused.

## 2019-10-13 ENCOUNTER — Other Ambulatory Visit: Payer: Self-pay | Admitting: Oncology

## 2019-10-13 ENCOUNTER — Telehealth (INDEPENDENT_AMBULATORY_CARE_PROVIDER_SITE_OTHER): Payer: BC Managed Care – PPO | Admitting: Primary Care

## 2019-10-13 ENCOUNTER — Encounter: Payer: Self-pay | Admitting: Primary Care

## 2019-10-13 DIAGNOSIS — F418 Other specified anxiety disorders: Secondary | ICD-10-CM | POA: Diagnosis not present

## 2019-10-13 NOTE — Patient Instructions (Signed)
You may pick up your letter later today.  It was a pleasure to see you today!

## 2019-10-13 NOTE — Progress Notes (Signed)
Subjective:    Patient ID: Judy Price, female    DOB: 1960-05-01, 59 y.o.   MRN: [id number]  HPI  Virtual Visit via Video Note  I connected with Judy Price on 10/13/19 at 10:20 AM EDT by a video enabled telemedicine application and verified that I am speaking with the correct person using two identifiers.  Location: Patient: Home Provider: Office Participants: Patient and myself   I discussed the limitations of evaluation and management by telemedicine and the availability of in person appointments. The patient expressed understanding and agreed to proceed.  History of Present Illness:  Judy Price is a 59 year old female with a history of prediabetes, chronic pain, obesity, hot flashes, restless leg syndrome who presents today to request jury duty excuse.  She is scheduled to appear in jury duty tomorrow, July 22nd, 2021 in [place] and is requesting to be excused. She was initially summoned one month ago. She is requesting an excuse due to emotion reasons. she has a lot of anxiety driving in [place] and to new places. She doesn't believe she'll be able to get herself to [facility] and participate in [place] [employer] due to anxiety. She has no one to drive her to [employer] as her husband will be working.  Her Juror number is [id number].   Observations/Objective:  Alert and oriented. Appears well, not sickly. No distress. Speaking in complete sentences.   Assessment and Plan:  Patient requesting jury duty excuse due to emotional reasons of anxiety. She does seem to appear anxious regarding driving in downtown [place].  Will try to excuse her for emotional reasons.  Letter to be written and printed.   Follow Up Instructions:  You may pick up your letter later today.  It was a pleasure to see you today!    I discussed the assessment and treatment plan with the patient. The patient was provided an opportunity to ask questions and all were answered. The patient  agreed with the plan and demonstrated an understanding of the instructions.   The patient was advised to call back or seek an in-person evaluation if the symptoms worsen or if the condition fails to improve as anticipated.    Pleas Koch, NP    Review of Systems  Respiratory: Negative for shortness of breath.   Cardiovascular: Negative for palpitations.  Psychiatric/Behavioral: The patient is nervous/anxious.        See HPI       Past Medical History:  Diagnosis Date  . Allergic conjunctivitis 06/24/2018  . Chicken pox   . Eye infection, bilateral 11/2018  . History of UTI   . Iron deficiency anemia    in the past  . Shingles 03/19/2018   also had a bout in september 2020. nothing active now     Social History   Socioeconomic History  . Marital status: Married    Spouse name: joaquin - husband  . Number of children: Not on file  . Years of education: Not on file  . Highest education level: Not on file  Occupational History  . Occupation: not currently working    Comment: HOMEMAKER  Tobacco Use  . Smoking status: Never Smoker  . Smokeless tobacco: Never Used  Vaping Use  . Vaping Use: Never used  Substance and Sexual Activity  . Alcohol use: No    Alcohol/week: 0.0 standard drinks  . Drug use: No  . Sexual activity: Not on file  Other Topics Concern  . Not on file  Social History Narrative   Married.   Moved from [place].   One child, two grandchildren Home maker   Enjoys decorating, Social worker.   Social Determinants of Health   Financial Resource Strain:   . Difficulty of Paying Living Expenses:   Food Insecurity:   . Worried About Charity fundraiser in the Last Year:   . Arboriculturist in the Last Year:   Transportation Needs:   . Film/video editor (Medical):   Marland Kitchen Lack of Transportation (Non-Medical):   Physical Activity:   . Days of Exercise per Week:   . Minutes of Exercise per Session:   Stress:   . Feeling of Stress :     Social Connections:   . Frequency of Communication with Friends and Family:   . Frequency of Social Gatherings with Friends and Family:   . Attends Religious Services:   . Active Member of Clubs or Organizations:   . Attends Archivist Meetings:   Marland Kitchen Marital Status:   Intimate Partner Violence:   . Fear of Current or Ex-Partner:   . Emotionally Abused:   Marland Kitchen Physically Abused:   . Sexually Abused:     Past Surgical History:  Procedure Laterality Date  . ABDOMINAL HYSTERECTOMY  2010  . GASTRIC BYPASS  2008  . SHOULDER ARTHROSCOPY WITH SUBACROMIAL DECOMPRESSION AND OPEN ROTATOR C Right 01/11/2019   Procedure: SHOULDER ARTHROSCOPY WITH SUBACROMIAL DECOMPRESSION ,OPEN BICEP TENDON REPAIR, MINI OPEN REGENTEN PATCH APPLICATION;  Surgeon: Leim Fabry, MD;  Location: [facility] ORS;  Service: Orthopedics;  Laterality: Right;  . TONSILLECTOMY AND ADENOIDECTOMY      Family History  Problem Relation Age of Onset  . Arthritis Mother   . Cancer Mother        Breast tumors  . Hyperlipidemia Mother   . Heart disease Mother   . Stroke Mother   . Hypertension Mother   . Heart attack Mother   . Hyperlipidemia Father   . Heart disease Father   . Stroke Father   . Heart attack Father   . Arthritis Maternal Grandmother   . Cancer Maternal Grandmother        Breast  . Hyperlipidemia Maternal Grandmother   . Heart disease Maternal Grandmother   . Stroke Maternal Grandmother   . Hypertension Maternal Grandmother   . Hyperlipidemia Maternal Grandfather   . Multiple myeloma Maternal Grandfather   . Hyperlipidemia Paternal Grandmother   . Hyperlipidemia Paternal Grandfather   . Uterine cancer Sister     Allergies  Allergen Reactions  . Codeine Nausea Only    Current Outpatient Medications on File Prior to Visit  Medication Sig Dispense Refill  . bimatoprost (LATISSE) 0.03 % ophthalmic solution APPLY ONE DROP TO EYELIDS AT BEDTIME    . ferrous sulfate 325 (65 FE) MG EC tablet Take  1 tablet (325 mg total) by mouth in the morning and at bedtime. 60 tablet 1  . gabapentin (NEURONTIN) 300 MG capsule Take 1 capsule (300 mg total) by mouth at bedtime. For restless legs. 90 capsule 3  . Multiple Vitamin (MULTIVITAMIN WITH MINERALS) TABS tablet Take 1 tablet by mouth daily.    Marland Kitchen PARoxetine (PAXIL-CR) 12.5 MG 24 hr tablet TAKE 1 TABLET BY MOUTH ONCE DAILY FOR HOT FLASHES 90 tablet 3   No current facility-administered medications on file prior to visit.    There were no vitals taken for this visit.   Objective:   Physical Exam Constitutional:  General: She is not in acute distress.    Appearance: Normal appearance.  Pulmonary:     Effort: Pulmonary effort is normal.  Neurological:     Mental Status: She is alert.  Psychiatric:        Mood and Affect: Mood normal.            Assessment & Plan:

## 2019-10-13 NOTE — Assessment & Plan Note (Signed)
Patient requesting jury duty excuse due to emotional reasons of anxiety. She does seem to appear anxious regarding driving in downtown [place].  Will try to excuse her for emotional reasons.  Letter to be written and printed.

## 2019-10-18 ENCOUNTER — Other Ambulatory Visit: Payer: Self-pay

## 2019-10-18 ENCOUNTER — Inpatient Hospital Stay: Payer: BC Managed Care – PPO

## 2019-10-18 VITALS — BP 106/71 | HR 58 | Temp 97.0°F | Resp 18

## 2019-10-18 DIAGNOSIS — D509 Iron deficiency anemia, unspecified: Secondary | ICD-10-CM

## 2019-10-18 DIAGNOSIS — D72819 Decreased white blood cell count, unspecified: Secondary | ICD-10-CM | POA: Diagnosis not present

## 2019-10-18 MED ORDER — SODIUM CHLORIDE 0.9 % IV SOLN
Freq: Once | INTRAVENOUS | Status: AC
  Filled 2019-10-18: qty 250

## 2019-10-18 MED ORDER — IRON SUCROSE 20 MG/ML IV SOLN
200.0000 mg | Freq: Once | INTRAVENOUS | Status: AC
  Administered 2019-10-18: 200 mg via INTRAVENOUS
  Filled 2019-10-18: qty 10

## 2019-10-18 MED ORDER — SODIUM CHLORIDE 0.9 % IV SOLN: 200.0000 mg | Freq: Once | INTRAVENOUS | Status: DC

## 2019-10-25 ENCOUNTER — Other Ambulatory Visit: Payer: Self-pay

## 2019-10-25 ENCOUNTER — Inpatient Hospital Stay: Payer: BC Managed Care – PPO | Attending: Oncology

## 2019-10-25 VITALS — BP 109/68 | HR 57 | Temp 98.2°F | Resp 18

## 2019-10-25 DIAGNOSIS — D509 Iron deficiency anemia, unspecified: Secondary | ICD-10-CM | POA: Diagnosis not present

## 2019-10-25 DIAGNOSIS — D72819 Decreased white blood cell count, unspecified: Secondary | ICD-10-CM | POA: Diagnosis present

## 2019-10-25 DIAGNOSIS — D696 Thrombocytopenia, unspecified: Secondary | ICD-10-CM | POA: Diagnosis not present

## 2019-10-25 DIAGNOSIS — Z79899 Other long term (current) drug therapy: Secondary | ICD-10-CM | POA: Insufficient documentation

## 2019-10-25 MED ORDER — IRON SUCROSE 20 MG/ML IV SOLN
200.0000 mg | Freq: Once | INTRAVENOUS | Status: AC
  Administered 2019-10-25: 200 mg via INTRAVENOUS
  Filled 2019-10-25: qty 10

## 2019-10-25 MED ORDER — SODIUM CHLORIDE 0.9 % IV SOLN
Freq: Once | INTRAVENOUS | Status: AC
  Filled 2019-10-25: qty 250

## 2019-10-25 MED ORDER — SODIUM CHLORIDE 0.9 % IV SOLN: 200.0000 mg | Freq: Once | INTRAVENOUS | Status: DC

## 2019-11-01 ENCOUNTER — Inpatient Hospital Stay: Payer: BC Managed Care – PPO

## 2019-11-05 ENCOUNTER — Inpatient Hospital Stay: Payer: BC Managed Care – PPO

## 2019-11-05 ENCOUNTER — Other Ambulatory Visit: Payer: Self-pay

## 2019-11-05 VITALS — BP 104/62 | HR 58 | Temp 97.9°F | Resp 18

## 2019-11-05 DIAGNOSIS — D509 Iron deficiency anemia, unspecified: Secondary | ICD-10-CM

## 2019-11-05 DIAGNOSIS — D72819 Decreased white blood cell count, unspecified: Secondary | ICD-10-CM | POA: Diagnosis not present

## 2019-11-05 MED ORDER — SODIUM CHLORIDE 0.9 % IV SOLN
Freq: Once | INTRAVENOUS | Status: AC
  Filled 2019-11-05: qty 250

## 2019-11-05 MED ORDER — IRON SUCROSE 20 MG/ML IV SOLN
200.0000 mg | Freq: Once | INTRAVENOUS | Status: AC
  Administered 2019-11-05: 200 mg via INTRAVENOUS
  Filled 2019-11-05: qty 10

## 2019-11-05 MED ORDER — SODIUM CHLORIDE 0.9 % IV SOLN: 200.0000 mg | Freq: Once | INTRAVENOUS | Status: DC

## 2019-11-08 ENCOUNTER — Ambulatory Visit: Payer: BC Managed Care – PPO

## 2019-12-11 ENCOUNTER — Other Ambulatory Visit: Payer: Self-pay

## 2019-12-11 ENCOUNTER — Ambulatory Visit (INDEPENDENT_AMBULATORY_CARE_PROVIDER_SITE_OTHER): Payer: BC Managed Care – PPO

## 2019-12-11 DIAGNOSIS — Z23 Encounter for immunization: Secondary | ICD-10-CM | POA: Diagnosis not present

## 2019-12-31 ENCOUNTER — Other Ambulatory Visit: Payer: BC Managed Care – PPO

## 2020-01-03 ENCOUNTER — Telehealth: Payer: BC Managed Care – PPO | Admitting: Oncology

## 2020-01-04 ENCOUNTER — Ambulatory Visit: Payer: BC Managed Care – PPO

## 2020-08-18 ENCOUNTER — Telehealth: Payer: BC Managed Care – PPO | Admitting: Physician Assistant

## 2020-08-18 DIAGNOSIS — H1033 Unspecified acute conjunctivitis, bilateral: Secondary | ICD-10-CM

## 2020-08-18 MED ORDER — POLYMYXIN B-TRIMETHOPRIM 10000-0.1 UNIT/ML-% OP SOLN: OPHTHALMIC | 0 refills | Status: DC

## 2020-08-18 NOTE — Progress Notes (Signed)
I have spent 5 minutes in review of e-visit questionnaire, review and updating patient chart, medical decision making and response to patient.   Madaleine Simmon Cody Cailey Trigueros, PA-C    

## 2020-08-18 NOTE — Progress Notes (Signed)
E-Visit for Pink Eye   We are sorry that you are not feeling well.  Here is how we plan to help!  Based on what you have shared with me it looks like you have conjunctivitis.  Conjunctivitis is a common inflammatory or infectious condition of the eye that is often referred to as "pink eye".  In most cases it is contagious (viral or bacterial). However, not all conjunctivitis requires antibiotics (ex. Allergic).  We have made appropriate suggestions for you based upon your presentation.  I have prescribed Polytrim Ophthalmic drops 1-2 drops 4 times a day times 5 days  Pink eye can be highly contagious.  It is typically spread through direct contact with secretions, or contaminated objects or surfaces that one may have touched.  Strict handwashing is suggested with soap and water is urged.  If not available, use alcohol based had sanitizer.  Avoid unnecessary touching of the eye.  If you wear contact lenses, you will need to refrain from wearing them until you see no white discharge from the eye for at least 24 hours after being on medication.  You should see symptom improvement in 1-2 days after starting the medication regimen.  Call us if symptoms are not improved in 1-2 days.  Home Care: Wash your hands often! Do not wear your contacts until you complete your treatment plan. Avoid sharing towels, bed linen, personal items with a person who has pink eye. See attention for anyone in your home with similar symptoms.  Get Help Right Away If: Your symptoms do not improve. You develop blurred or loss of vision. Your symptoms worsen (increased discharge, pain or redness)  Your e-visit answers were reviewed by a board certified advanced clinical practitioner to complete your personal care plan.  Depending on the condition, your plan could have included both over the counter or prescription medications.  If there is a problem please reply  once you have received a response from your provider.  Your  safety is important to us.  If you have drug allergies check your prescription carefully.    You can use MyChart to ask questions about today's visit, request a non-urgent call back, or ask for a work or school excuse for 24 hours related to this e-Visit. If it has been greater than 24 hours you will need to follow up with your provider, or enter a new e-Visit to address those concerns.   You will get an e-mail in the next two days asking about your experience.  I hope that your e-visit has been valuable and will speed your recovery. Thank you for using e-visits.     

## 2020-09-23 ENCOUNTER — Telehealth: Payer: Self-pay | Admitting: Nurse Practitioner

## 2020-09-23 ENCOUNTER — Telehealth: Payer: BC Managed Care – PPO | Admitting: Nurse Practitioner

## 2020-09-23 ENCOUNTER — Encounter: Payer: Self-pay | Admitting: Nurse Practitioner

## 2020-09-23 DIAGNOSIS — J069 Acute upper respiratory infection, unspecified: Secondary | ICD-10-CM

## 2020-09-23 DIAGNOSIS — J019 Acute sinusitis, unspecified: Secondary | ICD-10-CM

## 2020-09-23 MED ORDER — FLUTICASONE PROPIONATE 50 MCG/ACT NA SUSP: 2.0000 | Freq: Every day | NASAL | 0 refills | Status: DC

## 2020-09-23 MED ORDER — AMOXICILLIN-POT CLAVULANATE 875-125 MG PO TABS: 1.0000 | ORAL_TABLET | Freq: Two times a day (BID) | ORAL | 0 refills | Status: AC

## 2020-09-23 NOTE — Telephone Encounter (Signed)
Reviewed symptoms with patient. Explained that she would need a video visit to discuss antiviral management. Symptoms have been present for over one week so she is not a candidate for outpatient antiviral treatment.   She will continue with plan for antibiotic, and will seek follow up care at [facility] or ED if symptoms acutely worsen.   Patient is agreeable to plan.   Also discussed over the counter medications like Mucinex to help support cough and congestion.

## 2020-09-23 NOTE — Addendum Note (Signed)
Addended by: Viviano Simas E on: 09/23/2020 10:47 AM   Modules accepted: Orders

## 2020-09-23 NOTE — Progress Notes (Signed)
Thank you for providing clarity to your previous message.   We do still advise a COVID test, but given your symptoms have been present for one week or longer here are our recommendations:  Based on what you have shared with me it looks like you have sinusitis.  Sinusitis is inflammation and infection in the sinus cavities of the head.  Based on your presentation I believe you most likely have Acute Bacterial Sinusitis.  This is an infection caused by bacteria and is treated with antibiotics. I have prescribed Augmentin 875mg /125mg  one tablet twice daily with food, for 7 days. You may use an oral decongestant such as Mucinex D or if you have glaucoma or high blood pressure use plain Mucinex. Saline nasal spray help and can safely be used as often as needed for congestion.  If you develop worsening sinus pain, fever or notice severe headache and vision changes, or if symptoms are not better after completion of antibiotic, please schedule an appointment with a health care provider.    Sinus infections are not as easily transmitted as other respiratory infection, however we still recommend that you avoid close contact with loved ones, especially the very young and elderly.  Remember to wash your hands thoroughly throughout the day as this is the number one way to prevent the spread of infection!  Home Care: Only take medications as instructed by your medical team. Complete the entire course of an antibiotic. Do not take these medications with alcohol. A steam or ultrasonic humidifier can help congestion.  You can place a towel over your head and breathe in the steam from hot water coming from a faucet. Avoid close contacts especially the very young and the elderly. Cover your mouth when you cough or sneeze. Always remember to wash your hands.  Get Help Right Away If: You develop worsening fever or sinus pain. You develop a severe head ache or visual changes. Your symptoms persist after you have  completed your treatment plan.  Make sure you Understand these instructions. Will watch your condition. Will get help right away if you are not doing well or get worse. Meds ordered this encounter  Medications   fluticasone (FLONASE) 50 MCG/ACT nasal spray    Sig: Place 2 sprays into both nostrils daily.    Dispense:  16 g    Refill:  0   amoxicillin-clavulanate (AUGMENTIN) 875-125 MG tablet    Sig: Take 1 tablet by mouth 2 (two) times daily for 7 days. Take with food    Dispense:  14 tablet    Refill:  0    Thank you for choosing an e-visit.  Your e-visit answers were reviewed by a board certified advanced clinical practitioner to complete your personal care plan. Depending upon the condition, your plan could have included both over the counter or prescription medications.  Please review your pharmacy choice. Make sure the pharmacy is open so you can pick up prescription now. If there is a problem, you may contact your provider through and have the prescription routed to another pharmacy.  Your safety is important to [employer]. If you have drug allergies check your prescription carefully.   For the next 24 hours you can use MyChart to ask questions about today's visit, request a non-urgent call back, or ask for a work or school excuse. You will get an email in the next two days asking about your experience. I hope that your e-visit has been valuable and will speed your recovery.

## 2020-09-23 NOTE — Progress Notes (Signed)
E-Visit for Upper Respiratory Infection   We are sorry you are not feeling well.  Here is how we plan to help!  According to the history you submitted you have had symptoms for 4 days. Our best recommendation is for you to take a home COVID-19 test. These are available at most any pharmacy like [facility] or [facility]. Your symptoms are consistent with a viral infection, and may be due to a virus like the flu or COVID. If you take a home COVID test and test positive there are some additional medications that may be helpful.   We encourage you to take a test if you can today and let us know the results of that test. If you are unable to test you can continue to treat this with over the counter medications as you have been doing and we can add a few suggestions. Typically a bacterial sinus infection is not present until at least 7 days of nasal congestion and upper respiratory symptoms so at this point we do not believe an antibiotic would be helpful for your symptoms.  Based on what you have shared with me, it looks like you may have a viral upper respiratory infection.  Upper respiratory infections are caused by a large number of viruses as mentioned above.   Symptoms vary from person to person, with common symptoms including sore throat, cough, fatigue or lack of energy and feeling of general discomfort.  A low-grade fever of up to 100.4 may present, but is often uncommon.  Symptoms vary however, and are closely related to a person's age or underlying illnesses.  The most common symptoms associated with an upper respiratory infection are nasal discharge or congestion, cough, sneezing, headache and pressure in the ears and face.  These symptoms usually persist for about 3 to 10 days, but can last up to 2 weeks.  It is important to know that upper respiratory infections do not cause serious illness or complications in most cases.    Upper respiratory infections can be transmitted from person to person, with the  most common method of transmission being a person's hands.  The virus is able to live on the skin and can infect other persons for up to 2 hours after direct contact.  Also, these can be transmitted when someone coughs or sneezes; thus, it is important to cover the mouth to reduce this risk.  To keep the spread of the illness at bay, good hand hygiene is very important.  This is an infection that is most likely caused by a virus. There are no specific treatments other than to help you with the symptoms until the infection runs its course.  We are sorry you are not feeling well.  Here is how we plan to help!   For nasal congestion, you may use an oral decongestants such as Mucinex D or if you have glaucoma or high blood pressure use plain Mucinex.  Saline nasal spray or nasal drops can help and can safely be used as often as needed for congestion.  For your congestion, I have prescribed Fluticasone nasal spray one spray in each nostril twice a day  If you do not have a history of heart disease, hypertension, diabetes or thyroid disease, prostate/bladder issues or glaucoma, you may also use Sudafed to treat nasal congestion.  It is highly recommended that you consult with a pharmacist or your primary care physician to ensure this medication is safe for you to take.  If you have a sore or scratchy throat, use a saltwater gargle-  to  teaspoon of salt dissolved in a 4-ounce to 8-ounce glass of warm water.  Gargle the solution for approximately 15-30 seconds and then spit.  It is important not to swallow the solution.  You can also use throat lozenges/cough drops and Chloraseptic spray to help with throat pain or discomfort.  Warm or cold liquids can also be helpful in relieving throat pain.  For headache, pain or general discomfort, you can use Ibuprofen or Tylenol as directed.   Some authorities believe that zinc sprays or the use of Echinacea may shorten the course of your symptoms.   HOME  CARE Only take medications as instructed by your medical team. Be sure to drink plenty of fluids. Water is fine as well as fruit juices, sodas and electrolyte beverages. You may want to stay away from caffeine or alcohol. If you are nauseated, try taking small sips of liquids. How do you know if you are getting enough fluid? Your urine should be a pale yellow or almost colorless. Get rest. Taking a steamy shower or using a humidifier may help nasal congestion and ease sore throat pain. You can place a towel over your head and breathe in the steam from hot water coming from a faucet. Using a saline nasal spray works much the same way. Cough drops, hard candies and sore throat lozenges may ease your cough. Avoid close contacts especially the very young and the elderly Cover your mouth if you cough or sneeze Always remember to wash your hands.   GET HELP RIGHT AWAY IF: You develop worsening fever. If your symptoms do not improve within 10 days You develop yellow or green discharge from your nose over 3 days. You have coughing fits You develop a severe head ache or visual changes. You develop shortness of breath, difficulty breathing or start having chest pain Your symptoms persist after you have completed your treatment plan  MAKE SURE YOU  Understand these instructions. Will watch your condition. Will get help right away if you are not doing well or get worse.  Meds ordered this encounter  Medications   fluticasone (FLONASE) 50 MCG/ACT nasal spray    Sig: Place 2 sprays into both nostrils daily.    Dispense:  16 g    Refill:  0    I spent approximately 5 minutes reviewing the patient's history and symptoms and coordinating her care today.   Thank you for choosing an e-visit.  Your e-visit answers were reviewed by a board certified advanced clinical practitioner to complete your personal care plan. Depending upon the condition, your plan could have included both over the counter or  prescription medications.  Please review your pharmacy choice. Make sure the pharmacy is open so you can pick up prescription now. If there is a problem, you may contact your provider through [employer] and have the prescription routed to another pharmacy.  Your safety is important to [place]. If you have drug allergies check your prescription carefully.   For the next 24 hours you can use MyChart to ask questions about today's visit, request a non-urgent call back, or ask for a work or school excuse. You will get an email in the next two days asking about your experience. I hope that your e-visit has been valuable and will speed your recovery.

## 2020-10-15 ENCOUNTER — Other Ambulatory Visit: Payer: Self-pay | Admitting: Nurse Practitioner

## 2020-10-15 DIAGNOSIS — J069 Acute upper respiratory infection, unspecified: Secondary | ICD-10-CM

## 2020-11-01 ENCOUNTER — Other Ambulatory Visit: Payer: Self-pay | Admitting: Primary Care

## 2020-11-01 ENCOUNTER — Ambulatory Visit: Payer: BC Managed Care – PPO | Admitting: Primary Care

## 2020-11-01 ENCOUNTER — Other Ambulatory Visit: Payer: Self-pay

## 2020-11-01 VITALS — BP 110/76 | HR 74 | Temp 97.8°F | Ht 69.0 in

## 2020-11-01 DIAGNOSIS — G2581 Restless legs syndrome: Secondary | ICD-10-CM | POA: Diagnosis not present

## 2020-11-01 DIAGNOSIS — Z0000 Encounter for general adult medical examination without abnormal findings: Secondary | ICD-10-CM

## 2020-11-01 DIAGNOSIS — Z1231 Encounter for screening mammogram for malignant neoplasm of breast: Secondary | ICD-10-CM | POA: Diagnosis not present

## 2020-11-01 DIAGNOSIS — D509 Iron deficiency anemia, unspecified: Secondary | ICD-10-CM | POA: Diagnosis not present

## 2020-11-01 DIAGNOSIS — R232 Flushing: Secondary | ICD-10-CM | POA: Diagnosis not present

## 2020-11-01 DIAGNOSIS — R7303 Prediabetes: Secondary | ICD-10-CM | POA: Diagnosis not present

## 2020-11-01 DIAGNOSIS — Z9884 Bariatric surgery status: Secondary | ICD-10-CM | POA: Diagnosis not present

## 2020-11-01 DIAGNOSIS — J302 Other seasonal allergic rhinitis: Secondary | ICD-10-CM

## 2020-11-01 DIAGNOSIS — F418 Other specified anxiety disorders: Secondary | ICD-10-CM

## 2020-11-01 LAB — COMPREHENSIVE METABOLIC PANEL
ALT: 27 U/L (ref 0–35)
AST: 22 U/L (ref 0–37)
Albumin: 3.9 g/dL (ref 3.5–5.2)
Alkaline Phosphatase: 65 U/L (ref 39–117)
BUN: 17 mg/dL (ref 6–23)
CO2: 28 mEq/L (ref 19–32)
Calcium: 9.5 mg/dL (ref 8.4–10.5)
Chloride: 105 mEq/L (ref 96–112)
Creatinine, Ser: 0.78 mg/dL (ref 0.40–1.20)
GFR: 81.82 mL/min (ref >60.00)
Glucose, Bld: 88 mg/dL (ref 70–99)
Potassium: 5.1 mEq/L (ref 3.5–5.1)
Sodium: 140 mEq/L (ref 135–145)
Total Bilirubin: 0.4 mg/dL (ref 0.2–1.2)
Total Protein: 6.5 g/dL (ref 6.0–8.3)

## 2020-11-01 LAB — LIPID PANEL
Cholesterol: 178 mg/dL (ref 0–200)
HDL: 74.1 mg/dL (ref >39.00)
LDL Cholesterol: 91 mg/dL (ref 0–99)
NonHDL: 103.84
Total CHOL/HDL Ratio: 2
Triglycerides: 64 mg/dL (ref 0.0–149.0)
VLDL: 12.8 mg/dL (ref 0.0–40.0)

## 2020-11-01 LAB — CBC
HCT: 38.3 % (ref 36.0–46.0)
Hemoglobin: 12.4 g/dL (ref 12.0–15.0)
MCHC: 32.3 g/dL (ref 30.0–36.0)
MCV: 85.4 fl (ref 78.0–100.0)
Platelets: 196 10*3/uL (ref 150.0–400.0)
RBC: 4.48 Mil/uL (ref 3.87–5.11)
RDW: 13.8 % (ref 11.5–15.5)
WBC: 4.3 10*3/uL (ref 4.0–10.5)

## 2020-11-01 LAB — IBC + FERRITIN
Ferritin: 13.7 ng/mL (ref 10.0–291.0)
Iron: 102 ug/dL (ref 42–145)
Saturation Ratios: 25.7 % (ref 20.0–50.0)
TIBC: 397.6 ug/dL (ref 250.0–450.0)
Transferrin: 284 mg/dL (ref 212.0–360.0)

## 2020-11-01 LAB — VITAMIN B12: Vitamin B-12: 555 pg/mL (ref 211–911)

## 2020-11-01 LAB — VITAMIN D 25 HYDROXY (VIT D DEFICIENCY, FRACTURES): VITD: 41.3 ng/mL (ref 30.00–100.00)

## 2020-11-01 LAB — HEMOGLOBIN A1C: Hgb A1c MFr Bld: 5.8 % (ref 4.6–6.5)

## 2020-11-01 MED ORDER — PAROXETINE HCL ER 12.5 MG PO TB24: ORAL_TABLET | ORAL | 3 refills | Status: DC

## 2020-11-01 MED ORDER — GABAPENTIN 100 MG PO CAPS: ORAL_CAPSULE | ORAL | 0 refills | Status: DC

## 2020-11-01 NOTE — Assessment & Plan Note (Signed)
Denies concerns today. °Continue to monitor. °

## 2020-11-01 NOTE — Assessment & Plan Note (Signed)
Declines tetanus vaccine. Mammogram overdue, ordered and pending. Colonoscopy due in 2024 per patient.  Discussed the importance of a healthy diet and regular exercise in order for weight loss, and to reduce the risk of further co-morbidity.  Exam today stable. Labs pending.

## 2020-11-01 NOTE — Assessment & Plan Note (Signed)
Discussed the importance of a healthy diet and regular exercise in order for weight loss, and to reduce the risk of further co-morbidity. ? ?Repeat A1C pending. ?

## 2020-11-01 NOTE — Patient Instructions (Addendum)
Stop by the lab prior to leaving today. I will notify you of your results once received.   [facility] to [facility] your [facility].   It was a pleasure to see you today!  Preventive Care 62-62 Years Old, Female Preventive care refers to lifestyle choices and visits with your health care provider that can promote health and wellness. This includes: A yearly physical exam. This is also called an annual wellness visit. Regular dental and eye exams. Immunizations. Screening for certain conditions. Healthy lifestyle choices, such as: Eating a healthy diet. Getting regular exercise. Not using drugs or products that contain nicotine and tobacco. Limiting alcohol use. What can I expect for my preventive care visit? Physical exam Your health care provider will check your: Height and weight. These may be used to calculate your BMI (body mass index). BMI is a measurement that tells if you are at a healthy weight. Heart rate and blood pressure. Body temperature. Skin for abnormal spots. Counseling Your health care provider may ask you questions about your: Past medical problems. Family's medical history. Alcohol, tobacco, and drug use. Emotional well-being. Home life and relationship well-being. Sexual activity. Diet, exercise, and sleep habits. Work and work Statistician. Access to firearms. Method of birth control. Menstrual cycle. Pregnancy history. What immunizations do I need?  Vaccines are usually given at various ages, according to a schedule. Your health care provider will recommend vaccines for you based on your age, medicalhistory, and lifestyle or other factors, such as travel or where you work. What tests do I need? Blood tests Lipid and cholesterol levels. These may be checked every 5 years, or more often if you are over 63 years old. Hepatitis C test. Hepatitis B test. Screening Lung cancer screening. You may have this screening every year starting at age 22  if you have a 30-pack-year history of smoking and currently smoke or have quit within the past 15 years. Colorectal cancer screening. All adults should have this screening starting at age 30 and continuing until age 71. Your health care provider may recommend screening at age 57 if you are at increased risk. You will have tests every 1-10 years, depending on your results and the type of screening test. Diabetes screening. This is done by checking your blood sugar (glucose) after you have not eaten for a while (fasting). You may have this done every 1-3 years. Mammogram. This may be done every 1-2 years. Talk with your health care provider about when you should start having regular mammograms. This may depend on whether you have a family history of breast cancer. BRCA-related cancer screening. This may be done if you have a family history of breast, ovarian, tubal, or peritoneal cancers. Pelvic exam and Pap test. This may be done every 3 years starting at age 33. Starting at age 65, this may be done every 5 years if you have a Pap test in combination with an HPV test. Other tests STD (sexually transmitted disease) testing, if you are at risk. Bone density scan. This is done to screen for osteoporosis. You may have this scan if you are at high risk for osteoporosis. Talk with your health care provider about your test results, treatment options,and if necessary, the need for more tests. Follow these instructions at home: Eating and drinking  Eat a diet that includes fresh fruits and vegetables, whole grains, lean protein, and low-fat dairy products. Take vitamin and mineral supplements as recommended by your health care provider. Do not drink alcohol  if: Your health care provider tells you not to drink. You are pregnant, may be pregnant, or are planning to become pregnant. If you drink alcohol: Limit how much you have to 0-1 drink a day. Be aware of how much alcohol is in your drink. In the  [place] one drink equals one 12 oz bottle of beer (355 mL), one 5 oz glass of wine (148 mL), or one 1 oz glass of hard liquor (44 mL).  Lifestyle Take daily care of your teeth and gums. Brush your teeth every morning and night with fluoride toothpaste. Floss one time each day. Stay active. Exercise for at least 30 minutes 5 or more days each week. Do not use any products that contain nicotine or tobacco, such as cigarettes, e-cigarettes, and chewing tobacco. If you need help quitting, ask your health care provider. Do not use drugs. If you are sexually active, practice safe sex. Use a condom or other form of protection to prevent STIs (sexually transmitted infections). If you do not wish to become pregnant, use a form of birth control. If you plan to become pregnant, see your health care provider for a prepregnancy visit. If told by your health care provider, take low-dose aspirin daily starting at age 9. Find healthy ways to cope with stress, such as: Meditation, yoga, or listening to music. Journaling. Talking to a trusted person. Spending time with friends and family. Safety Always wear your seat belt while driving or riding in a vehicle. Do not drive: If you have been drinking alcohol. Do not ride with someone who has been drinking. When you are tired or distracted. While texting. Wear a helmet and other protective equipment during sports activities. If you have firearms in your house, make sure you follow all gun safety procedures. What's next? Visit your health care provider once a year for an annual wellness visit. Ask your health care provider how often you should have your eyes and teeth checked. Stay up to date on all vaccines. This information is not intended to replace advice given to you by your health care provider. Make sure you discuss any questions you have with your healthcare provider. Document Revised: 12/14/2019 Document Reviewed: 11/20/2017 Elsevier Patient  Education  2022 [employer].

## 2020-11-01 NOTE — Assessment & Plan Note (Signed)
Doing well on gabapentin 300 mg and would like to wean down. Rx for gabapentin 100 mg sent to pharmacy. Discussed to start with 200 mg HS x several weeks, then reduce to 100 mg x several weeks.  She will update.

## 2020-11-01 NOTE — Assessment & Plan Note (Signed)
Overall stable. Seasonal.  Continue to monitor.

## 2020-11-01 NOTE — Assessment & Plan Note (Signed)
History of iron infusions. Repeat iron and CBC levels pending.

## 2020-11-01 NOTE — Assessment & Plan Note (Signed)
Doing well on Paxil CR 12.5 mg, continue same. Refills sent to pharmacy.

## 2020-11-01 NOTE — Progress Notes (Signed)
Subjective:    Patient ID: Judy Price, female    DOB: 1960-04-20, 60 y.o.   MRN: [id number]  HPI  Judy Price is a very pleasant 60 y.o. female who presents today for complete physical and follow up of chronic conditions.  Immunizations: -Tetanus: Unsure, declines  -Influenza: Due this season  -Covid-19: 2 vaccines -Shingles: Shingrix   Diet: Fair diet.  Exercise: Regular exercise.  Eye exam: Completed several years ago. Dental exam: Completes semi-annually  Pap Smear: Hysterectomy Mammogram: Completed years ago Colonoscopy: Completed in 2014  BP Readings from Last 3 Encounters:  11/01/20 110/76  11/05/19 104/62  10/25/19 109/68       Review of Systems  Constitutional:  Negative for unexpected weight change.  HENT:  Negative for rhinorrhea.   Respiratory:  Negative for shortness of breath.   Cardiovascular:  Negative for chest pain.  Gastrointestinal:  Negative for constipation and diarrhea.  Genitourinary:  Negative for difficulty urinating.  Musculoskeletal:  Negative for arthralgias and myalgias.  Skin:  Negative for rash.  Allergic/Immunologic: Negative for environmental allergies.  Neurological:  Negative for dizziness, numbness and headaches.  Psychiatric/Behavioral:  The patient is not nervous/anxious.         Past Medical History:  Diagnosis Date   Allergic conjunctivitis 06/24/2018   Chicken pox    Eye infection, bilateral 11/2018   History of UTI    Iron deficiency anemia    in the past   Shingles 03/19/2018   also had a bout in september 2020. nothing active now    Social History   Socioeconomic History   Marital status: Married    Spouse name: joaquin - husband   Number of children: Not on file   Years of education: Not on file   Highest education level: Not on file  Occupational History   Occupation: not currently working    Comment: HOMEMAKER  Tobacco Use   Smoking status: Never   Smokeless tobacco: Never  Vaping Use    Vaping Use: Never used  Substance and Sexual Activity   Alcohol use: No    Alcohol/week: 0.0 standard drinks   Drug use: No   Sexual activity: Not on file  Other Topics Concern   Not on file  Social History Narrative   Married.   Moved from [place].   One child, two grandchildren Home maker   Enjoys decorating, Social worker.   Social Determinants of Health   Financial Resource Strain: Not on file  Food Insecurity: Not on file  Transportation Needs: Not on file  Physical Activity: Not on file  Stress: Not on file  Social Connections: Not on file  Intimate Partner Violence: Not on file    Past Surgical History:  Procedure Laterality Date   ABDOMINAL HYSTERECTOMY  2010   GASTRIC BYPASS  2008   SHOULDER ARTHROSCOPY WITH SUBACROMIAL DECOMPRESSION AND OPEN ROTATOR C Right 01/11/2019   Procedure: SHOULDER ARTHROSCOPY WITH SUBACROMIAL DECOMPRESSION ,OPEN BICEP TENDON REPAIR, MINI OPEN REGENTEN PATCH APPLICATION;  Surgeon: Leim Fabry, MD;  Location: [facility] ORS;  Service: Orthopedics;  Laterality: Right;   TONSILLECTOMY AND ADENOIDECTOMY      Family History  Problem Relation Age of Onset   Arthritis Mother    Cancer Mother        Breast tumors   Hyperlipidemia Mother    Heart disease Mother    Stroke Mother    Hypertension Mother    Heart attack Mother    Hyperlipidemia Father    Heart disease  Father    Stroke Father    Heart attack Father    Arthritis Maternal Grandmother    Cancer Maternal Grandmother        Breast   Hyperlipidemia Maternal Grandmother    Heart disease Maternal Grandmother    Stroke Maternal Grandmother    Hypertension Maternal Grandmother    Hyperlipidemia Maternal Grandfather    Multiple myeloma Maternal Grandfather    Hyperlipidemia Paternal Grandmother    Hyperlipidemia Paternal Grandfather    Uterine cancer Sister     Allergies  Allergen Reactions   Codeine Nausea Only    Current Outpatient Medications on File Prior to Visit   Medication Sig Dispense Refill   cholecalciferol (VITAMIN D3) 25 MCG (1000 UNIT) tablet Take 1,000 Units by mouth daily.     gabapentin (NEURONTIN) 300 MG capsule Take 1 capsule (300 mg total) by mouth at bedtime. For restless legs. 90 capsule 3   Multiple Vitamin (MULTIVITAMIN WITH MINERALS) TABS tablet Take 1 tablet by mouth daily.     PARoxetine (PAXIL-CR) 12.5 MG 24 hr tablet TAKE 1 TABLET BY MOUTH ONCE DAILY FOR HOT FLASHES 90 tablet 3   No current facility-administered medications on file prior to visit.    BP 110/76   Pulse 74   Temp 97.8 F (36.6 C) (Temporal)   Ht '5\' 9"'  (1.753 m)   SpO2 99%   BMI 27.76 kg/m  Objective:   Physical Exam HENT:     Right Ear: Tympanic membrane and ear canal normal.     Left Ear: Tympanic membrane and ear canal normal.     Nose: Nose normal.  Eyes:     Conjunctiva/sclera: Conjunctivae normal.     Pupils: Pupils are equal, round, and reactive to light.  Neck:     Thyroid: No thyromegaly.  Cardiovascular:     Rate and Rhythm: Normal rate and regular rhythm.     Heart sounds: No murmur heard. Pulmonary:     Effort: Pulmonary effort is normal.     Breath sounds: Normal breath sounds. No rales.  Abdominal:     General: Bowel sounds are normal.     Palpations: Abdomen is soft.     Tenderness: There is no abdominal tenderness.  Musculoskeletal:        General: Normal range of motion.     Cervical back: Neck supple.  Lymphadenopathy:     Cervical: No cervical adenopathy.  Skin:    General: Skin is warm and dry.     Findings: No rash.  Neurological:     Mental Status: She is alert and oriented to person, place, and time.     Cranial Nerves: No cranial nerve deficit.     Deep Tendon Reflexes: Reflexes are normal and symmetric.  Psychiatric:        Mood and Affect: Mood normal.          Assessment & Plan:      This visit occurred during the SARS-CoV-2 public health emergency.  Safety protocols were in place, including  screening questions prior to the visit, additional usage of staff PPE, and extensive cleaning of exam room while observing appropriate contact time as indicated for disinfecting solutions.

## 2020-11-01 NOTE — Assessment & Plan Note (Signed)
Repeat iron/CBC, B12, and vitamin D pending.

## 2020-11-14 ENCOUNTER — Other Ambulatory Visit: Payer: Self-pay

## 2020-11-14 ENCOUNTER — Ambulatory Visit
Admission: RE | Admit: 2020-11-14 | Discharge: 2020-11-14 | Disposition: A | Discharge status: Home / Self Care | Payer: BC Managed Care – PPO | Source: Ambulatory Visit | Attending: Primary Care | Admitting: Primary Care

## 2020-11-14 DIAGNOSIS — Z1231 Encounter for screening mammogram for malignant neoplasm of breast: Secondary | ICD-10-CM | POA: Diagnosis not present

## 2020-12-06 DIAGNOSIS — G2581 Restless legs syndrome: Secondary | ICD-10-CM

## 2020-12-07 MED ORDER — GABAPENTIN 300 MG PO CAPS: 300.0000 mg | ORAL_CAPSULE | Freq: Every day | ORAL | 3 refills | Status: DC

## 2021-02-06 ENCOUNTER — Telehealth: Payer: BC Managed Care – PPO | Admitting: Family

## 2021-02-06 DIAGNOSIS — J019 Acute sinusitis, unspecified: Secondary | ICD-10-CM

## 2021-02-06 MED ORDER — AMOXICILLIN-POT CLAVULANATE 875-125 MG PO TABS
1.0000 | ORAL_TABLET | Freq: Two times a day (BID) | ORAL | 0 refills | Status: DC
Start: 2021-02-06 — End: 2021-03-15

## 2021-02-06 NOTE — Progress Notes (Signed)

## 2021-03-15 ENCOUNTER — Telehealth: Payer: BC Managed Care – PPO | Admitting: Physician Assistant

## 2021-03-15 DIAGNOSIS — J111 Influenza due to unidentified influenza virus with other respiratory manifestations: Secondary | ICD-10-CM | POA: Diagnosis not present

## 2021-03-15 MED ORDER — OSELTAMIVIR PHOSPHATE 75 MG PO CAPS: 75.0000 mg | ORAL_CAPSULE | Freq: Two times a day (BID) | ORAL | 0 refills | Status: DC

## 2021-03-15 NOTE — Progress Notes (Signed)
E visit for Flu like symptoms   We are sorry that you are not feeling well.  Here is how we plan to help! Based on what you have shared with me it looks like you may have a respiratory virus that may be influenza.  Influenza or "the flu" is   an infection caused by a respiratory virus. The flu virus is highly contagious and persons who did not receive their yearly flu vaccination may "catch" the flu from close contact.  We have anti-viral medications to treat the viruses that cause this infection. They are not a "cure" and only shorten the course of the infection. These prescriptions are most effective when they are given within the first 2 days of "flu" symptoms. Antiviral medication are indicated if you have a high risk of complications from the flu. You should  also consider an antiviral medication if you are in close contact with someone who is at risk. These medications can help patients avoid complications from the flu  but have side effects that you should know. Possible side effects from Tamiflu or oseltamivir include nausea, vomiting, diarrhea, dizziness, headaches, eye redness, sleep problems or other respiratory symptoms. You should not take Tamiflu if you have an allergy to oseltamivir or any to the ingredients in Tamiflu.  Based upon your symptoms and potential risk factors I have prescribed Oseltamivir (Tamiflu).  It has been sent to your designated pharmacy.  You will take one 75 mg capsule orally twice a day for the next 5 days.  ANYONE WHO HAS FLU SYMPTOMS SHOULD: Stay home. The flu is highly contagious and going out or to work exposes others! Be sure to drink plenty of fluids. Water is fine as well as fruit juices, sodas and electrolyte beverages. You may want to stay away from caffeine or alcohol. If you are nauseated, try taking small sips of liquids. How do you know if you are getting enough fluid? Your urine should be a pale yellow or almost colorless. Get rest. Taking a steamy  shower or using a humidifier may help nasal congestion and ease sore throat pain. Using a saline nasal spray works much the same way. Cough drops, hard candies and sore throat lozenges may ease your cough. Line up a caregiver. Have someone check on you regularly.   GET HELP RIGHT AWAY IF: You cannot keep down liquids or your medications. You become short of breath Your fell like you are going to pass out or loose consciousness. Your symptoms persist after you have completed your treatment plan MAKE SURE YOU  Understand these instructions. Will watch your condition. Will get help right away if you are not doing well or get worse.  Your e-visit answers were reviewed by a board certified advanced clinical practitioner to complete your personal care plan.  Depending on the condition, your plan could have included both over the counter or prescription medications.  If there is a problem please reply  once you have received a response from your provider.  Your safety is important to us.  If you have drug allergies check your prescription carefully.    You can use MyChart to ask questions about today's visit, request a non-urgent call back, or ask for a work or school excuse for 24 hours related to this e-Visit. If it has been greater than 24 hours you will need to follow up with your provider, or enter a new e-Visit to address those concerns.  You will get an e-mail in the next   two days asking about your experience.  I hope that your e-visit has been valuable and will speed your recovery. Thank you for using e-visits.  I provided 5 minutes of non face-to-face time during this encounter for chart review and documentation.   

## 2021-04-21 IMAGING — US US EXTREM LOW VENOUS*R*
1 series · 13 of 24 positions shown · non-contrast
Comparison: None.

CLINICAL DATA: Right lower extremity pain for the past 4-5 days.
Pain is posterior to the right knee. Evaluate for DVT.



[Series 1: us extrem low venous*right* · 0.07mm/px · 13 of 33 slices shown]
[im 1/33]
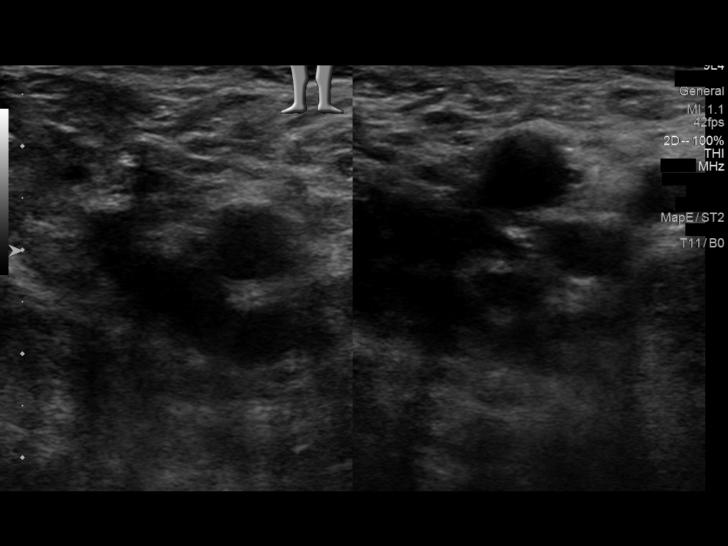
[im 3/33]
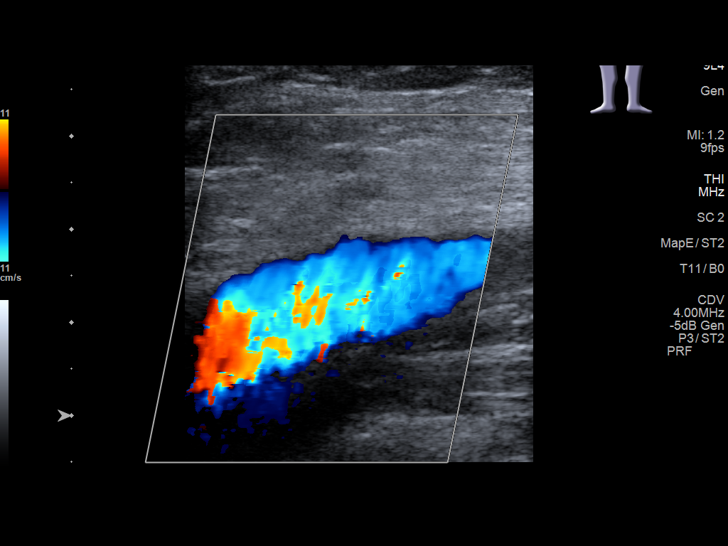
[im 6/33]
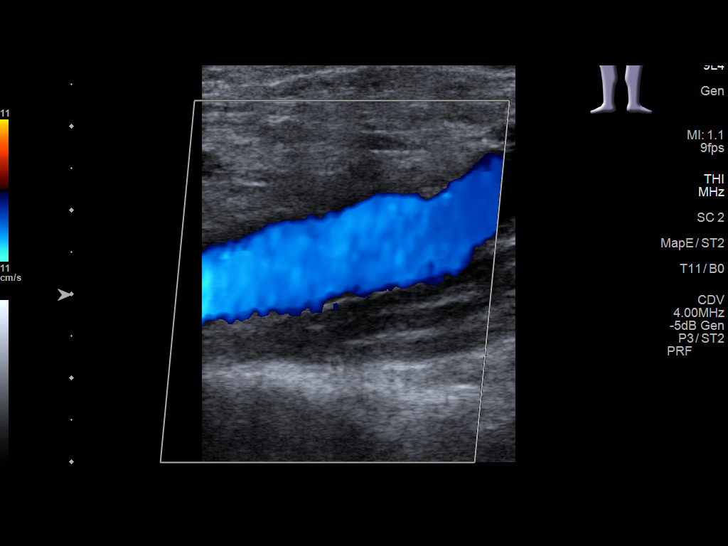
[im 9/33]
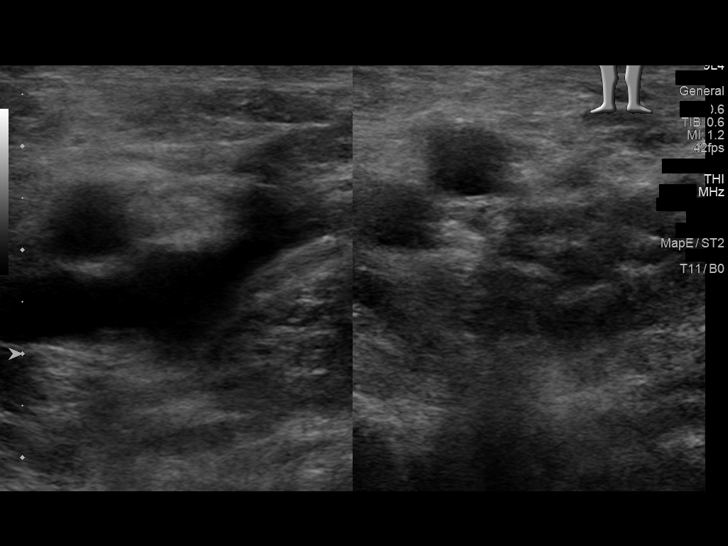
[im 12/33]
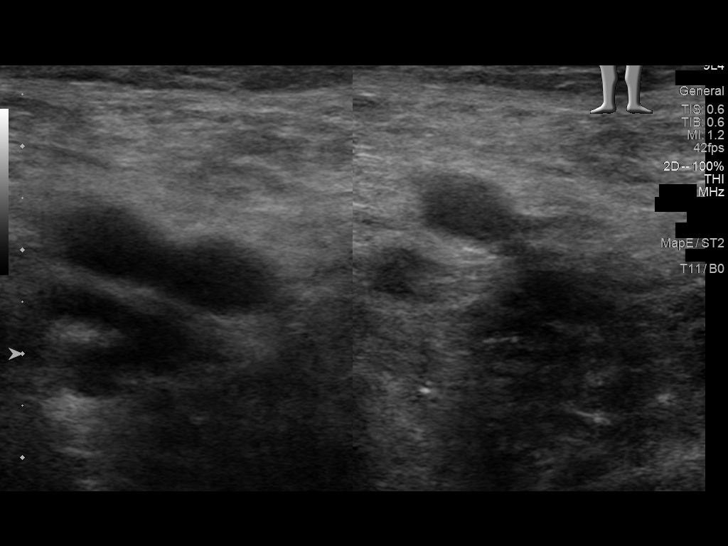
[im 14/33]
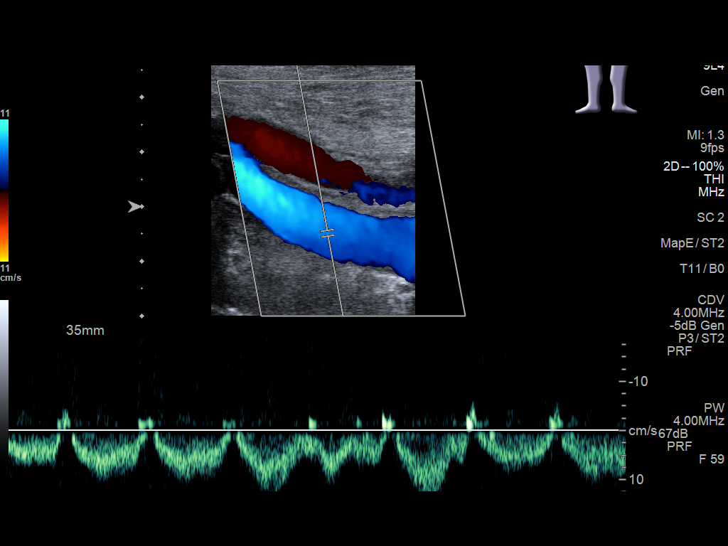
[im 17/33]
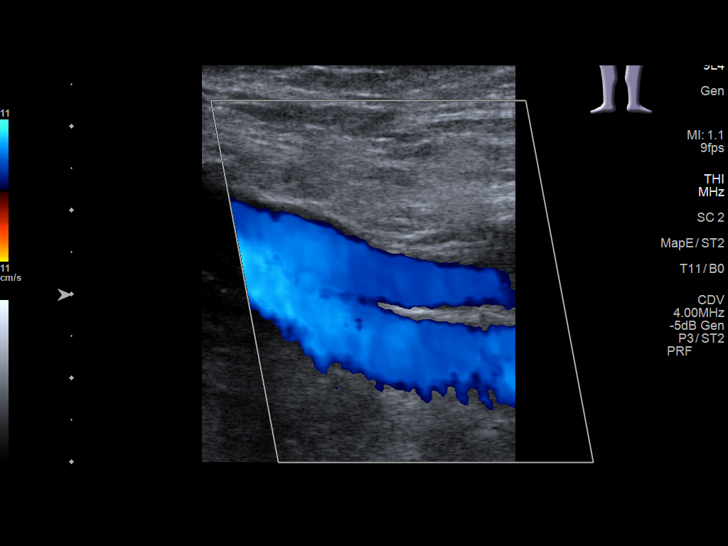
[im 19/33]
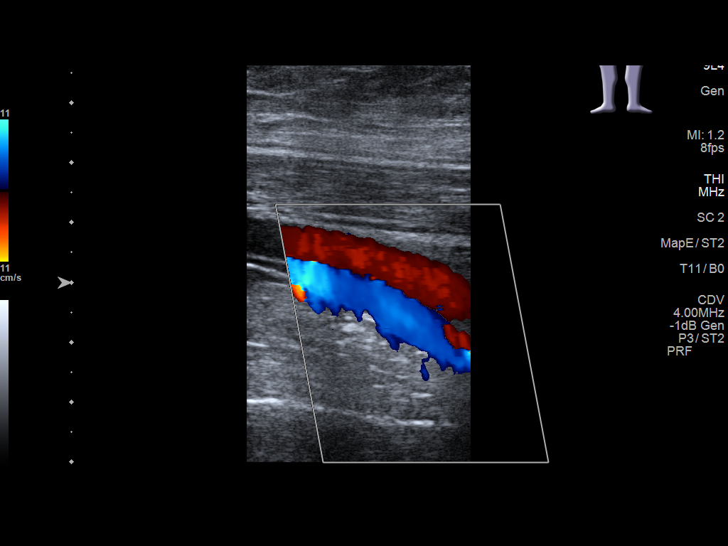
[im 21/33]
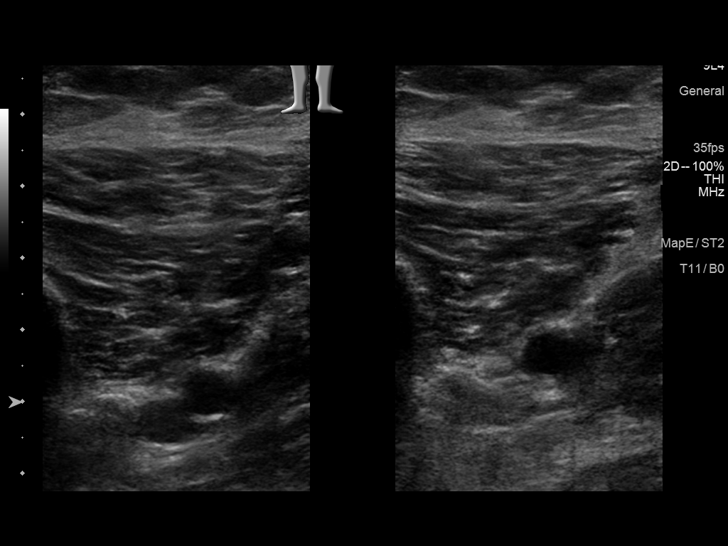
[im 24/33]
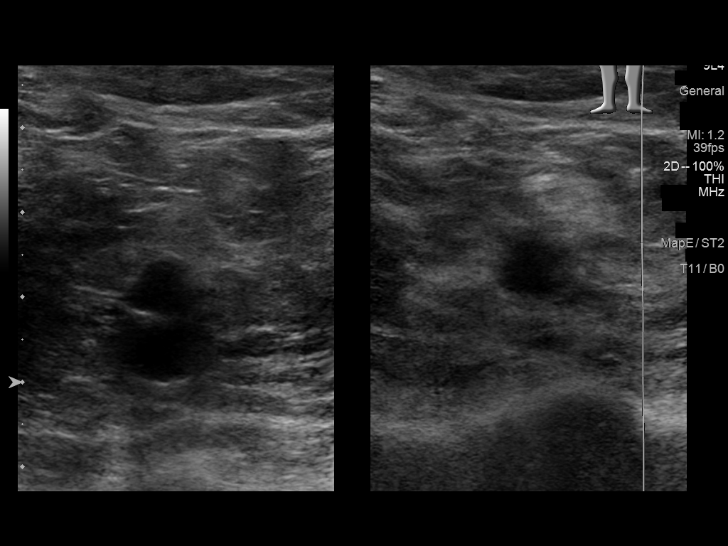
[im 27/33]
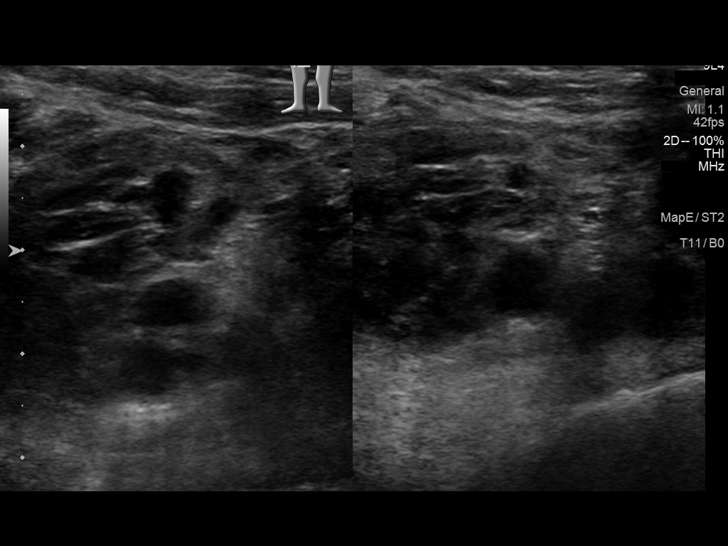
[im 30/33]
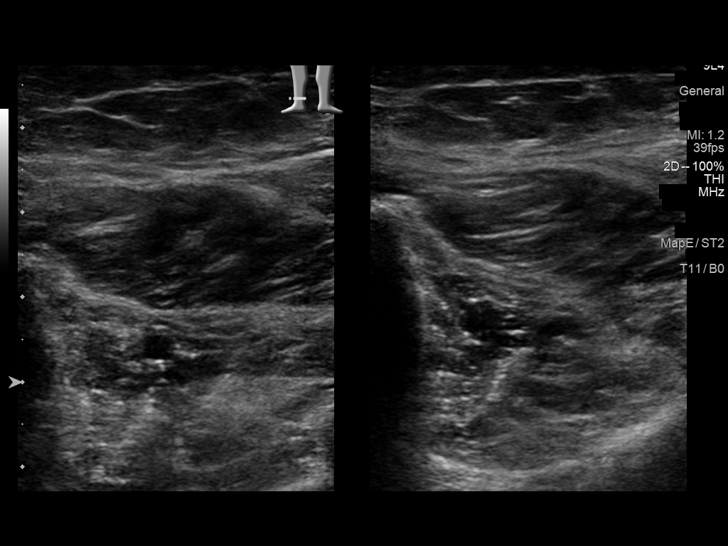
[im 33/33]
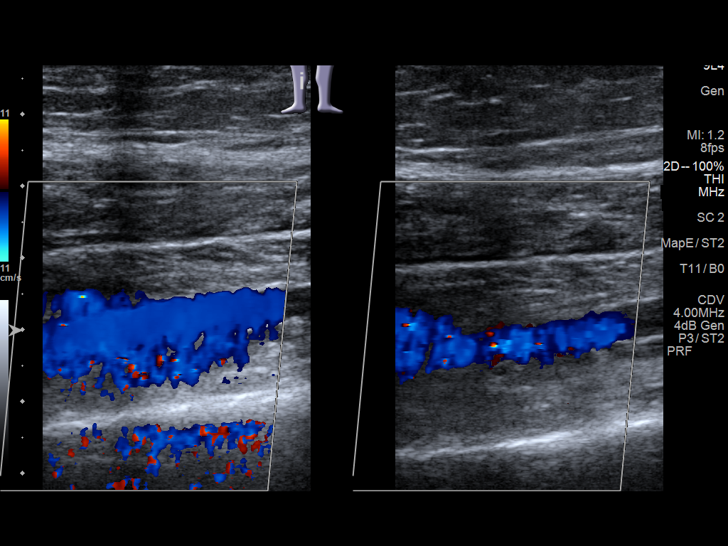

[13 of 24 positions shown; findings below may reference images not displayed]

FINDINGS: Contralateral Common Femoral Vein: Respiratory phasicity is normal
and symmetric with the symptomatic side. No evidence of thrombus.
Normal compressibility.

Common Femoral Vein: No evidence of thrombus. Normal
compressibility, respiratory phasicity and response to augmentation.

Saphenofemoral Junction: No evidence of thrombus. Normal
compressibility and flow on color Doppler imaging.

Profunda Femoral Vein: No evidence of thrombus. Normal
compressibility and flow on color Doppler imaging.

Femoral Vein: No evidence of thrombus. Normal compressibility,
respiratory phasicity and response to augmentation.

Popliteal Vein: No evidence of thrombus. Normal compressibility,
respiratory phasicity and response to augmentation.

Calf Veins: No evidence of thrombus. Normal compressibility and flow
on color Doppler imaging.

Superficial Great Saphenous Vein: No evidence of thrombus. Normal
compressibility.

Venous Reflux:  None.

Other Findings:  None.
IMPRESSION: 1. No evidence of DVT within the right lower extremity.
2. No evidence of a Baker's cyst.

## 2021-05-30 ENCOUNTER — Encounter: Payer: Self-pay | Admitting: Primary Care

## 2021-05-30 ENCOUNTER — Other Ambulatory Visit: Payer: Self-pay

## 2021-05-30 ENCOUNTER — Ambulatory Visit: Payer: BC Managed Care – PPO | Admitting: Primary Care

## 2021-05-30 DIAGNOSIS — R253 Fasciculation: Secondary | ICD-10-CM | POA: Diagnosis not present

## 2021-05-30 NOTE — Progress Notes (Signed)
? ?Subjective:  ? ? Patient ID: Judy Price, female    DOB: 07-29-1960, 61 y.o.   MRN: [id number] ? ?HPI ? ?Judy Price is a very pleasant 61 y.o. female with a history of hot flashes, iron deficiency anemia, chronic fatigue, chronic neck/shoulder/knee pain, prediabetes, restless leg syndrome, situational anxiety who presents today to discuss eye twitching. ? ?Twitching began about 2 months ago to her left eye, intermittent, felt to the mid upper eyelid. About 1 month ago her left eye twitching subsided and she began to notice the right eye twitching, mid upper lid, intermittent. ? ?She denies excess caffeine use, visual changes, prolonged computer use, sinus pressure, rhinorrhea, anxiety, eye pain, eye drainage, new medications.  ? ?Her last eye exam was years ago.  ? ? ?Review of Systems  ?HENT:  Negative for congestion, postnasal drip and rhinorrhea.   ?Eyes:  Negative for pain, discharge, redness, itching and visual disturbance.  ?Neurological:  Negative for headaches.  ?Psychiatric/Behavioral:  The patient is not nervous/anxious.   ? ?   ? ? ?Past Medical History:  ?Diagnosis Date  ? Allergic conjunctivitis 06/24/2018  ? Chicken pox   ? Eye infection, bilateral 11/2018  ? History of UTI   ? Iron deficiency anemia   ? in the past  ? Shingles 03/19/2018  ? also had a bout in september 2020. nothing active now  ? ? ?Social History  ? ?Socioeconomic History  ? Marital status: Married  ?  Spouse name: joaquin - husband  ? Number of children: Not on file  ? Years of education: Not on file  ? Highest education level: Not on file  ?Occupational History  ? Occupation: not currently working  ?  Comment: HOMEMAKER  ?Tobacco Use  ? Smoking status: Never  ? Smokeless tobacco: Never  ?Vaping Use  ? Vaping Use: Never used  ?Substance and Sexual Activity  ? Alcohol use: No  ?  Alcohol/week: 0.0 standard drinks  ? Drug use: No  ? Sexual activity: Not on file  ?Other Topics Concern  ? Not on file  ?Social History Narrative  ?  Married.  ? Moved from [place].  ? One child, two grandchildren Home maker  ? Enjoys decorating, making jewelry.  ? ?Social Determinants of Health  ? ?Financial Resource Strain: Not on file  ?Food Insecurity: Not on file  ?Transportation Needs: Not on file  ?Physical Activity: Not on file  ?Stress: Not on file  ?Social Connections: Not on file  ?Intimate Partner Violence: Not on file  ? ? ?Past Surgical History:  ?Procedure Laterality Date  ? ABDOMINAL HYSTERECTOMY  2010  ? GASTRIC BYPASS  2008  ? SHOULDER ARTHROSCOPY WITH SUBACROMIAL DECOMPRESSION AND OPEN ROTATOR C Right 01/11/2019  ? Procedure: SHOULDER ARTHROSCOPY WITH SUBACROMIAL DECOMPRESSION ,OPEN BICEP TENDON REPAIR, MINI OPEN REGENTEN PATCH APPLICATION;  Surgeon: Leim Fabry, MD;  Location: [facility];  Service: Orthopedics;  Laterality: Right;  ? TONSILLECTOMY AND ADENOIDECTOMY    ? ? ?Family History  ?Problem Relation Age of Onset  ? Arthritis Mother   ? Cancer Mother   ?     Breast tumors  ? Hyperlipidemia Mother   ? Heart disease Mother   ? Stroke Mother   ? Hypertension Mother   ? Heart attack Mother   ? Hyperlipidemia Father   ? Heart disease Father   ? Stroke Father   ? Heart attack Father   ? Uterine cancer Sister   ? Breast cancer Maternal Grandmother 4  ?  Arthritis Maternal Grandmother   ? Cancer Maternal Grandmother   ?     Breast  ? Hyperlipidemia Maternal Grandmother   ? Heart disease Maternal Grandmother   ? Stroke Maternal Grandmother   ? Hypertension Maternal Grandmother   ? Hyperlipidemia Maternal Grandfather   ? Multiple myeloma Maternal Grandfather   ? Hyperlipidemia Paternal Grandmother   ? Hyperlipidemia Paternal Grandfather   ? ? ?Allergies  ?Allergen Reactions  ? Codeine Nausea Only  ? ? ?Current Outpatient Medications on File Prior to Visit  ?Medication Sig Dispense Refill  ? cholecalciferol (VITAMIN D3) 25 MCG (1000 UNIT) tablet Take 1,000 Units by mouth daily.    ? gabapentin (NEURONTIN) 300 MG capsule Take 1 capsule (300 mg  total) by mouth at bedtime. For restless legs. 90 capsule 3  ? Multiple Vitamin (MULTIVITAMIN WITH MINERALS) TABS tablet Take 1 tablet by mouth daily.    ? PARoxetine (PAXIL-CR) 12.5 MG 24 hr tablet TAKE 1 TABLET BY MOUTH ONCE DAILY FOR HOT FLASHES 90 tablet 3  ? ?No current facility-administered medications on file prior to visit.  ? ? ?BP 120/74   Pulse 66   Temp 98.6 ?F (37 ?C) (Oral)   Ht '5\' 9"'  (1.753 m)   SpO2 96%   BMI 27.76 kg/m?  ?Objective:  ? Physical Exam ?HENT:  ?   Right Ear: Tympanic membrane and ear canal normal.  ?   Left Ear: Tympanic membrane and ear canal normal.  ?Eyes:  ?   General: Lids are normal.     ?   Right eye: No discharge.     ?   Left eye: No discharge.  ?   Extraocular Movements: Extraocular movements intact.  ?   Right eye: No nystagmus.  ?   Left eye: No nystagmus.  ?   Conjunctiva/sclera:  ?   Right eye: Right conjunctiva is not injected.  ?   Left eye: Left conjunctiva is not injected.  ?   Pupils: Pupils are equal, round, and reactive to light.  ?Neurological:  ?   Mental Status: She is alert and oriented to person, place, and time.  ?   Cranial Nerves: No cranial nerve deficit.  ? ? ? ? ? ?   ?Assessment & Plan:  ? ? ? ? ?This visit occurred during the SARS-CoV-2 public health emergency.  Safety protocols were in place, including screening questions prior to the visit, additional usage of staff PPE, and extensive cleaning of exam room while observing appropriate contact time as indicated for disinfecting solutions.  ?

## 2021-05-30 NOTE — Patient Instructions (Signed)
Consider Zyrtec 10 mg at bedtime. ? ?Also consider scheduling an eye exam.  ? ?Please contact me if no improvement after 1-2 weeks.  ? ?It was a pleasure to see you today! ? ?

## 2021-05-30 NOTE — Assessment & Plan Note (Signed)
To the eyes, now right eye only. ? ?Exam today grossly negative.  ?No other symptoms.  ? ?Recommended she set up an eye exam.  ?Trial of Zyrtec 10 mg HS recommended. ? ?Suspect her symptoms will resolve sporadically.  ?Consider oral steroids if no improvement.  ?

## 2021-10-01 ENCOUNTER — Other Ambulatory Visit: Payer: Self-pay | Admitting: Primary Care

## 2021-10-01 DIAGNOSIS — Z1231 Encounter for screening mammogram for malignant neoplasm of breast: Secondary | ICD-10-CM

## 2021-10-23 ENCOUNTER — Other Ambulatory Visit: Payer: Self-pay | Admitting: Primary Care

## 2021-10-23 DIAGNOSIS — R232 Flushing: Secondary | ICD-10-CM

## 2021-11-10 ENCOUNTER — Telehealth: Payer: BC Managed Care – PPO | Admitting: Nurse Practitioner

## 2021-11-10 DIAGNOSIS — J019 Acute sinusitis, unspecified: Secondary | ICD-10-CM | POA: Diagnosis not present

## 2021-11-10 MED ORDER — AMOXICILLIN-POT CLAVULANATE 875-125 MG PO TABS: 1.0000 | ORAL_TABLET | Freq: Two times a day (BID) | ORAL | 0 refills | Status: DC

## 2021-11-10 NOTE — Progress Notes (Signed)

## 2021-11-19 ENCOUNTER — Other Ambulatory Visit: Payer: Self-pay | Admitting: Primary Care

## 2021-11-19 DIAGNOSIS — R232 Flushing: Secondary | ICD-10-CM

## 2021-11-19 MED ORDER — PAROXETINE HCL ER 12.5 MG PO TB24: ORAL_TABLET | ORAL | 0 refills | Status: DC

## 2021-11-19 NOTE — Telephone Encounter (Signed)
Noted  

## 2021-11-19 NOTE — Telephone Encounter (Signed)
Patient notified as instructed by telephone and verbalized understanding. Patient scheduled for a physical 12/25/21 due to her schedule.

## 2021-11-19 NOTE — Telephone Encounter (Signed)
Patient is due for follow up/CPE. Joellen, will you contact patient to schedule for September? Let me know once she's scheduled.

## 2021-12-02 ENCOUNTER — Other Ambulatory Visit: Payer: Self-pay | Admitting: Primary Care

## 2021-12-02 DIAGNOSIS — G2581 Restless legs syndrome: Secondary | ICD-10-CM

## 2021-12-07 ENCOUNTER — Telehealth: Payer: BC Managed Care – PPO | Admitting: Physician Assistant

## 2021-12-07 DIAGNOSIS — J329 Chronic sinusitis, unspecified: Secondary | ICD-10-CM | POA: Diagnosis not present

## 2021-12-07 DIAGNOSIS — J302 Other seasonal allergic rhinitis: Secondary | ICD-10-CM | POA: Diagnosis not present

## 2021-12-07 MED ORDER — DOXYCYCLINE HYCLATE 100 MG PO TABS
100.0000 mg | ORAL_TABLET | Freq: Two times a day (BID) | ORAL | 0 refills | Status: DC
Start: 2021-12-07 — End: 2022-01-16

## 2021-12-07 MED ORDER — CETIRIZINE HCL 10 MG PO TABS: 10.0000 mg | ORAL_TABLET | Freq: Every day | ORAL | 0 refills | Status: DC

## 2021-12-07 MED ORDER — FLUTICASONE PROPIONATE 50 MCG/ACT NA SUSP: 2.0000 | Freq: Every day | NASAL | 0 refills | Status: DC

## 2021-12-07 NOTE — Progress Notes (Signed)
E-Visit for Sinus Problems  We are sorry that you are not feeling well.  Here is how we plan to help!  Based on what you have shared with me it looks like you have sinusitis.  Sinusitis is inflammation and infection in the sinus cavities of the head.  Based on your presentation I believe you most likely have Acute Bacterial Sinusitis.  This is an infection caused by bacteria and is treated with antibiotics. I have prescribed Doxycycline 100mg  by mouth twice a day for 10 days. You may use an oral decongestant such as Mucinex D or if you have glaucoma or high blood pressure use plain Mucinex. Saline nasal spray help and can safely be used as often as needed for congestion.  If you develop worsening sinus pain, fever or notice severe headache and vision changes, or if symptoms are not better after completion of antibiotic, please schedule an appointment with a health care provider.    Sinus infections are not as easily transmitted as other respiratory infection, however we still recommend that you avoid close contact with loved ones, especially the very young and elderly.  Remember to wash your hands thoroughly throughout the day as this is the number one way to prevent the spread of infection!  E visit for Allergic Rhinitis We are sorry that you are not feeling well.  Here is how we plan to help! With reviewing your chart, I noticed you get sinus infections seasonally and recurrently. This means your sinus infections are most likely triggered by seasonal allergies of some kind. To prevent recurrent sinus infections, I do think you would benefit from starting allergy medications as needed. It would be recommended to start these now through fall to prevent recurrence. You could think stop during the winter, then restart in the Spring if needed.   Based on what you have shared with me it looks like you have Allergic Rhinitis.  Rhinitis is when a reaction occurs that causes nasal congestion, runny nose,  sneezing, and itching.  Most types of rhinitis are caused by an inflammation and are associated with symptoms in the eyes ears or throat. There are several types of rhinitis.  The most common are acute rhinitis, which is usually caused by a viral illness, allergic or seasonal rhinitis, and nonallergic or year-round rhinitis.  Nasal allergies occur certain times of the year.  Allergic rhinitis is caused when allergens in the air trigger the release of histamine in the body.  Histamine causes itching, swelling, and fluid to build up in the fragile linings of the nasal passages, sinuses and eyelids.  An itchy nose and clear discharge are common.  I recommend the following over the counter treatments: You should take a daily dose of antihistamine Zyrtec (Cetirizine) 10mg  daily  I also would recommend a nasal spray: Flonase 2 sprays into each nostril once daily and Saline 1 spray into each nostril as needed   Home Care: Only take medications as instructed by your medical team. Complete the entire course of an antibiotic. Do not take these medications with alcohol. A steam or ultrasonic humidifier can help congestion.  You can place a towel over your head and breathe in the steam from hot water coming from a faucet. Avoid close contacts especially the very young and the elderly. Cover your mouth when you cough or sneeze. Always remember to wash your hands.   GET HELP RIGHT AWAY IF:  If your symptoms do not improve within 10 days You become short of  breath You have coughing fits You develop worsening fever or sinus pain. You develop a severe head ache or visual changes. Your symptoms persist after you have completed your treatment plan.  MAKE SURE YOU:  Understand these instructions Will watch your condition Will get help right away if you are not doing well or get worse  Thank you for choosing an e-visit. Your e-visit answers were reviewed by a board certified advanced clinical  practitioner to complete your personal care plan. Depending upon the condition, your plan could have included both over the counter or prescription medications. Please review your pharmacy choice. Be sure that the pharmacy you have chosen is open so that you can pick up your prescription now.  If there is a problem you may message your provider in MyChart to have the prescription routed to another pharmacy. Your safety is important to [place]. If you have drug allergies check your prescription carefully.  For the next 24 hours, you can use MyChart to ask questions about today's visit, request a non-urgent call back, or ask for a work or school excuse from your e-visit provider. You will get an email in the next two days asking about your experience. I hope that your e-visit has been valuable and will speed your recovery.  I provided 5 minutes of non face-to-face time during this encounter for chart review and documentation.

## 2021-12-19 ENCOUNTER — Ambulatory Visit: Payer: BC Managed Care – PPO | Admitting: Primary Care

## 2021-12-25 ENCOUNTER — Encounter: Payer: BC Managed Care – PPO | Admitting: Primary Care

## 2022-01-03 ENCOUNTER — Encounter: Payer: BC Managed Care – PPO | Admitting: Primary Care

## 2022-01-09 ENCOUNTER — Encounter: Payer: BC Managed Care – PPO | Admitting: Primary Care

## 2022-01-16 ENCOUNTER — Ambulatory Visit: Payer: BC Managed Care – PPO | Admitting: Primary Care

## 2022-01-16 ENCOUNTER — Encounter: Payer: Self-pay | Admitting: Primary Care

## 2022-01-16 VITALS — BP 122/78 | HR 53 | Temp 97.5°F | Ht 69.0 in

## 2022-01-16 DIAGNOSIS — Z0000 Encounter for general adult medical examination without abnormal findings: Secondary | ICD-10-CM

## 2022-01-16 DIAGNOSIS — R7303 Prediabetes: Secondary | ICD-10-CM

## 2022-01-16 DIAGNOSIS — G2581 Restless legs syndrome: Secondary | ICD-10-CM

## 2022-01-16 DIAGNOSIS — J302 Other seasonal allergic rhinitis: Secondary | ICD-10-CM | POA: Diagnosis not present

## 2022-01-16 DIAGNOSIS — D509 Iron deficiency anemia, unspecified: Secondary | ICD-10-CM

## 2022-01-16 DIAGNOSIS — F418 Other specified anxiety disorders: Secondary | ICD-10-CM

## 2022-01-16 DIAGNOSIS — R232 Flushing: Secondary | ICD-10-CM

## 2022-01-16 LAB — COMPREHENSIVE METABOLIC PANEL
ALT: 15 U/L (ref 0–35)
AST: 18 U/L (ref 0–37)
Albumin: 4.2 g/dL (ref 3.5–5.2)
Alkaline Phosphatase: 53 U/L (ref 39–117)
BUN: 21 mg/dL (ref 6–23)
CO2: 29 mEq/L (ref 19–32)
Calcium: 9.7 mg/dL (ref 8.4–10.5)
Chloride: 105 mEq/L (ref 96–112)
Creatinine, Ser: 0.79 mg/dL (ref 0.40–1.20)
GFR: 79.9 mL/min (ref >60.00)
Glucose, Bld: 95 mg/dL (ref 70–99)
Potassium: 4.6 mEq/L (ref 3.5–5.1)
Sodium: 141 mEq/L (ref 135–145)
Total Bilirubin: 0.5 mg/dL (ref 0.2–1.2)
Total Protein: 7 g/dL (ref 6.0–8.3)

## 2022-01-16 LAB — CBC
HCT: 40.9 % (ref 36.0–46.0)
Hemoglobin: 13.1 g/dL (ref 12.0–15.0)
MCHC: 32 g/dL (ref 30.0–36.0)
MCV: 84.3 fl (ref 78.0–100.0)
Platelets: 218 10*3/uL (ref 150.0–400.0)
RBC: 4.85 Mil/uL (ref 3.87–5.11)
RDW: 13.8 % (ref 11.5–15.5)
WBC: 4.6 10*3/uL (ref 4.0–10.5)

## 2022-01-16 LAB — LIPID PANEL
Cholesterol: 199 mg/dL (ref 0–200)
HDL: 77.6 mg/dL (ref >39.00)
LDL Cholesterol: 110 mg/dL — ABNORMAL HIGH (ref 0–99)
NonHDL: 121.83
Total CHOL/HDL Ratio: 3
Triglycerides: 57 mg/dL (ref 0.0–149.0)
VLDL: 11.4 mg/dL (ref 0.0–40.0)

## 2022-01-16 LAB — HEMOGLOBIN A1C: Hgb A1c MFr Bld: 5.9 % (ref 4.6–6.5)

## 2022-01-16 NOTE — Assessment & Plan Note (Signed)
Controlled.   Continue gabapentin 300 mg HS.

## 2022-01-16 NOTE — Assessment & Plan Note (Signed)
Controlled and would like to wean off.  Reduce Paxil to every other day, then every 3-4 days, then stop. She will update with any questions or problems.

## 2022-01-16 NOTE — Progress Notes (Signed)
Subjective:    Patient ID: Judy Price, female    DOB: 1960/10/18, 61 y.o.   MRN: [id number]  HPI  Judy Price is a very pleasant 61 y.o. female who presents today for complete physical and follow up of chronic conditions.  Immunizations: -Tetanus: Unsure, declines  -Influenza: Declines  -Shingles: Shingrix  Diet: Fair diet.  Exercise: No regular exercise.  Eye exam: Completed several years ago.  Dental exam: Completes semi-annually   Pap Smear: Hysterectomy  Mammogram: Completed in 2022, due again  Colonoscopy: Completed in 2014, due 2024  BP Readings from Last 3 Encounters:  01/16/22 122/78  05/30/21 120/74  11/01/20 110/76        Review of Systems  Constitutional:  Negative for unexpected weight change.  HENT:  Negative for rhinorrhea.   Respiratory:  Negative for cough and shortness of breath.   Cardiovascular:  Negative for chest pain.  Gastrointestinal:  Negative for constipation and diarrhea.  Genitourinary:  Negative for difficulty urinating.  Musculoskeletal:  Negative for arthralgias.  Skin:  Negative for rash.  Allergic/Immunologic: Positive for environmental allergies.  Neurological:  Negative for dizziness and headaches.  Psychiatric/Behavioral:  The patient is not nervous/anxious.          Past Medical History:  Diagnosis Date   Allergic conjunctivitis 06/24/2018   Chicken pox    Eye infection, bilateral 11/2018   History of UTI    Iron deficiency anemia    in the past   Shingles 03/19/2018   also had a bout in september 2020. nothing active now    Social History   Socioeconomic History   Marital status: Married    Spouse name: joaquin - husband   Number of children: Not on file   Years of education: Not on file   Highest education level: Not on file  Occupational History   Occupation: not currently working    Comment: HOMEMAKER  Tobacco Use   Smoking status: Never   Smokeless tobacco: Never  Vaping Use   Vaping Use: Never  used  Substance and Sexual Activity   Alcohol use: No    Alcohol/week: 0.0 standard drinks of alcohol   Drug use: No   Sexual activity: Not on file  Other Topics Concern   Not on file  Social History Narrative   Married.   Moved from [place].   One child, two grandchildren Home maker   Enjoys decorating, Social worker.   Social Determinants of Health   Financial Resource Strain: Not on file  Food Insecurity: Not on file  Transportation Needs: Not on file  Physical Activity: Not on file  Stress: Not on file  Social Connections: Not on file  Intimate Partner Violence: Not on file    Past Surgical History:  Procedure Laterality Date   ABDOMINAL HYSTERECTOMY  2010   GASTRIC BYPASS  2008   SHOULDER ARTHROSCOPY WITH SUBACROMIAL DECOMPRESSION AND OPEN ROTATOR C Right 01/11/2019   Procedure: SHOULDER ARTHROSCOPY WITH SUBACROMIAL DECOMPRESSION ,OPEN BICEP TENDON REPAIR, MINI OPEN REGENTEN PATCH APPLICATION;  Surgeon: Leim Fabry, MD;  Location: [facility];  Service: Orthopedics;  Laterality: Right;   TONSILLECTOMY AND ADENOIDECTOMY      Family History  Problem Relation Age of Onset   Arthritis Mother    Cancer Mother        Breast tumors   Hyperlipidemia Mother    Heart disease Mother    Stroke Mother    Hypertension Mother    Heart attack Mother    Hyperlipidemia  Father    Heart disease Father    Stroke Father    Heart attack Father    Uterine cancer Sister    Breast cancer Maternal Grandmother 47   Arthritis Maternal Grandmother    Cancer Maternal Grandmother        Breast   Hyperlipidemia Maternal Grandmother    Heart disease Maternal Grandmother    Stroke Maternal Grandmother    Hypertension Maternal Grandmother    Hyperlipidemia Maternal Grandfather    Multiple myeloma Maternal Grandfather    Hyperlipidemia Paternal Grandmother    Hyperlipidemia Paternal Grandfather     Allergies  Allergen Reactions   Codeine Nausea Only    Current Outpatient  Medications on File Prior to Visit  Medication Sig Dispense Refill   BIOTIN PO Take by mouth.     gabapentin (NEURONTIN) 300 MG capsule Take 1 capsule (300 mg total) by mouth at bedtime. For restless legs. Office visit required for further refills. 90 capsule 0   Multiple Vitamin (MULTIVITAMIN WITH MINERALS) TABS tablet Take 1 tablet by mouth daily.     PARoxetine (PAXIL-CR) 12.5 MG 24 hr tablet TAKE 1 TABLET BY MOUTH ONCE DAILY FOR HOT FLASHES. Office visit required for further refills. 90 tablet 0   cholecalciferol (VITAMIN D3) 25 MCG (1000 UNIT) tablet Take 1,000 Units by mouth daily. (Patient not taking: Reported on 01/16/2022)     No current facility-administered medications on file prior to visit.    BP 122/78   Pulse (!) 53   Temp (!) 97.5 F (36.4 C) (Temporal)   Ht '5\' 9"'  (1.753 m)   SpO2 95%   BMI 27.76 kg/m  Objective:   Physical Exam HENT:     Right Ear: Tympanic membrane and ear canal normal.     Left Ear: Tympanic membrane and ear canal normal.     Nose: Nose normal.  Eyes:     Conjunctiva/sclera: Conjunctivae normal.     Pupils: Pupils are equal, round, and reactive to light.  Neck:     Thyroid: No thyromegaly.  Cardiovascular:     Rate and Rhythm: Normal rate and regular rhythm.     Heart sounds: No murmur heard. Pulmonary:     Effort: Pulmonary effort is normal.     Breath sounds: Normal breath sounds. No rales.  Abdominal:     General: Bowel sounds are normal.     Palpations: Abdomen is soft.     Tenderness: There is no abdominal tenderness.  Musculoskeletal:        General: Normal range of motion.     Cervical back: Neck supple.  Lymphadenopathy:     Cervical: No cervical adenopathy.  Skin:    General: Skin is warm and dry.     Findings: No rash.  Neurological:     Mental Status: She is alert and oriented to person, place, and time.     Cranial Nerves: No cranial nerve deficit.     Deep Tendon Reflexes: Reflexes are normal and symmetric.   Psychiatric:        Mood and Affect: Mood normal.           Assessment & Plan:   Problem List Items Addressed This Visit       Cardiovascular and Mediastinum   Hot flashes    Controlled and would like to wean off.  Reduce Paxil to every other day, then every 3-4 days, then stop. She will update with any questions or problems.  Other   Prediabetes    Repeat A1C pending.   Discussed the importance of a healthy diet and regular exercise in order for weight loss, and to reduce the risk of further co-morbidity.       Relevant Orders   Lipid panel   Hemoglobin A1c   Comprehensive metabolic panel   Seasonal allergies    Controlled.  Continue to monitor.       Restless leg syndrome    Controlled.   Continue gabapentin 300 mg HS.       Absolute anemia   Relevant Orders   CBC   Situational anxiety    Controlled.  Continue to monitor.      Preventative health care - Primary    Declines Tetanus and influenza vaccines. Mammogram due, she will schedule. Colonoscopy UTD, due 2024.  Discussed the importance of a healthy diet and regular exercise in order for weight loss, and to reduce the risk of further co-morbidity.  Exam stable. Labs pending.  Follow up in 1 year for repeat physical.           Pleas Koch, NP

## 2022-01-16 NOTE — Assessment & Plan Note (Signed)
Declines Tetanus and influenza vaccines. Mammogram due, she will schedule. Colonoscopy UTD, due 2024.  Discussed the importance of a healthy diet and regular exercise in order for weight loss, and to reduce the risk of further co-morbidity.  Exam stable. Labs pending.  Follow up in 1 year for repeat physical.

## 2022-01-16 NOTE — Assessment & Plan Note (Signed)
Repeat A1C pending.  Discussed the importance of a healthy diet and regular exercise in order for weight loss, and to reduce the risk of further co-morbidity.  

## 2022-01-16 NOTE — Patient Instructions (Signed)
Stop by the lab prior to leaving today. I will notify you of your results once received.   Try taking paroxetine every other day for a few weeks, then every 3-4 days, then stop.   [facility].   It was a pleasure to see you today!

## 2022-01-16 NOTE — Assessment & Plan Note (Signed)
Controlled.  Continue to monitor.  

## 2022-01-16 NOTE — Assessment & Plan Note (Signed)
Repeat CBC pending. Asymptomatic.

## 2022-01-22 ENCOUNTER — Telehealth: Payer: BC Managed Care – PPO | Admitting: Physician Assistant

## 2022-01-22 DIAGNOSIS — R21 Rash and other nonspecific skin eruption: Secondary | ICD-10-CM

## 2022-01-22 NOTE — Progress Notes (Signed)
I have spent 5 minutes in review of e-visit questionnaire, review and updating patient chart, medical decision making and response to patient.   Lyndel Dancel Cody Marisabel Macpherson, PA-C    

## 2022-01-22 NOTE — Progress Notes (Addendum)
E Visit for Rash  We are sorry that you are not feeling well. Here is how we plan to help!  Based on what you shared with me it looks like you have contact dermatitis.  Contact dermatitis is a skin rash caused by something that touches the skin and causes irritation or inflammation.  Your skin may be red, swollen, dry, cracked, and itch.  The rash should go away in a few days but can last a few weeks.  If you get a rash, it's important to figure out what caused it so the irritant can be avoided in the future. Avoid use of this soap again, keep area clean and dry. Apply OTC topical cortisone cream as this is safer than prescription steroid creams to use in this area. Also consider use of OTC Claritin or Benadryl to reduce inflammation and itch. If not improving with this over next 48-72 hours, you need to be evaluated in person.   HOME CARE:  Take cool showers and avoid direct sunlight. Apply cool compress or wet dressings. Take a bath in an oatmeal bath.  Sprinkle content of one Aveeno packet under running faucet with comfortably warm water.  Bathe for 15-20 minutes, 1-2 times daily.  Pat dry with a towel. Do not rub the rash. Use hydrocortisone cream. Take an antihistamine like Benadryl for widespread rashes that itch.  The adult dose of Benadryl is 25-50 mg by mouth 4 times daily. Caution:  This type of medication may cause sleepiness.  Do not drink alcohol, drive, or operate dangerous machinery while taking antihistamines.  Do not take these medications if you have prostate enlargement.  Read package instructions thoroughly on all medications that you take.  GET HELP RIGHT AWAY IF:  Symptoms don't go away after treatment. Severe itching that persists. If you rash spreads or swells. If you rash begins to smell. If it blisters and opens or develops a yellow-brown crust. You develop a fever. You have a sore throat. You become short of breath.  MAKE SURE YOU:  Understand these  instructions. Will watch your condition. Will get help right away if you are not doing well or get worse.  Thank you for choosing an e-visit.  Your e-visit answers were reviewed by a board certified advanced clinical practitioner to complete your personal care plan. Depending upon the condition, your plan could have included both over the counter or prescription medications.  Please review your pharmacy choice. Make sure the pharmacy is open so you can pick up prescription now. If there is a problem, you may contact your provider through [employer] and have the prescription routed to another pharmacy.  Your safety is important to [place]. If you have drug allergies check your prescription carefully.   For the next 24 hours you can use MyChart to ask questions about today's visit, request a non-urgent call back, or ask for a work or school excuse. You will get an email in the next two days asking about your experience. I hope that your e-visit has been valuable and will speed your recovery.

## 2022-02-19 ENCOUNTER — Telehealth: Payer: BC Managed Care – PPO | Admitting: Physician Assistant

## 2022-02-19 DIAGNOSIS — B9689 Other specified bacterial agents as the cause of diseases classified elsewhere: Secondary | ICD-10-CM

## 2022-02-19 DIAGNOSIS — J019 Acute sinusitis, unspecified: Secondary | ICD-10-CM | POA: Diagnosis not present

## 2022-02-19 MED ORDER — DOXYCYCLINE HYCLATE 100 MG PO TABS: 100.0000 mg | ORAL_TABLET | Freq: Two times a day (BID) | ORAL | 0 refills | Status: DC

## 2022-02-19 NOTE — Progress Notes (Signed)
I have spent 5 minutes in review of e-visit questionnaire, review and updating patient chart, medical decision making and response to patient.   Vance Belcourt Cody Abdirahman Chittum, PA-C    

## 2022-02-19 NOTE — Progress Notes (Signed)

## 2022-02-25 ENCOUNTER — Other Ambulatory Visit: Payer: Self-pay | Admitting: Primary Care

## 2022-02-25 DIAGNOSIS — G2581 Restless legs syndrome: Secondary | ICD-10-CM

## 2022-03-02 ENCOUNTER — Other Ambulatory Visit: Payer: Self-pay | Admitting: Primary Care

## 2022-03-02 DIAGNOSIS — R232 Flushing: Secondary | ICD-10-CM

## 2022-03-07 ENCOUNTER — Telehealth: Payer: BC Managed Care – PPO | Admitting: Emergency Medicine

## 2022-03-07 DIAGNOSIS — H1033 Unspecified acute conjunctivitis, bilateral: Secondary | ICD-10-CM

## 2022-03-07 MED ORDER — POLYMYXIN B-TRIMETHOPRIM 10000-0.1 UNIT/ML-% OP SOLN: 1.0000 [drp] | OPHTHALMIC | 0 refills | Status: DC

## 2022-03-07 NOTE — Progress Notes (Signed)

## 2022-03-19 ENCOUNTER — Telehealth: Payer: BC Managed Care – PPO | Admitting: Family Medicine

## 2022-03-19 DIAGNOSIS — B9689 Other specified bacterial agents as the cause of diseases classified elsewhere: Secondary | ICD-10-CM

## 2022-03-19 NOTE — Progress Notes (Signed)
[place]

## 2022-04-16 ENCOUNTER — Ambulatory Visit
Admission: RE | Admit: 2022-04-16 | Discharge: 2022-04-16 | Disposition: A | Discharge status: Home / Self Care | Payer: BC Managed Care – PPO | Source: Ambulatory Visit | Attending: Primary Care | Admitting: Primary Care

## 2022-04-16 DIAGNOSIS — Z1231 Encounter for screening mammogram for malignant neoplasm of breast: Secondary | ICD-10-CM | POA: Diagnosis not present

## 2022-04-30 ENCOUNTER — Ambulatory Visit: Payer: BC Managed Care – PPO | Admitting: Primary Care

## 2022-05-14 ENCOUNTER — Ambulatory Visit: Payer: BC Managed Care – PPO | Admitting: Primary Care

## 2022-05-24 ENCOUNTER — Encounter: Payer: Self-pay | Admitting: Family Medicine

## 2022-05-24 ENCOUNTER — Ambulatory Visit: Payer: BC Managed Care – PPO | Admitting: Family Medicine

## 2022-05-24 VITALS — BP 120/76 | HR 64 | Temp 97.6°F | Ht 69.0 in | Wt 189.0 lb

## 2022-05-24 DIAGNOSIS — M25551 Pain in right hip: Secondary | ICD-10-CM | POA: Diagnosis not present

## 2022-05-24 DIAGNOSIS — M25552 Pain in left hip: Secondary | ICD-10-CM | POA: Diagnosis not present

## 2022-05-24 MED ORDER — MELOXICAM 15 MG PO TABS: 15.0000 mg | ORAL_TABLET | Freq: Every day | ORAL | 0 refills | Status: DC

## 2022-05-24 NOTE — Assessment & Plan Note (Addendum)
Acute.. Likely trochanteric bursitis vs spinal stenosis.  She will start meloxicam daily... does not like taking medications  ( refused prednisone taper) so would like to go ahead and get on schedule to be considered by Dr. Lorelei Pont for injection in hips.  Given home PT exercise and will apply ice.   No red flags for neurogenic claudication.

## 2022-05-24 NOTE — Progress Notes (Addendum)
Patient ID: Judy Price, female    DOB: [birth year], 62 y.o.   MRN: [id number]  This visit was conducted in person.  BP 120/76   Pulse 64   Temp 97.6 F (36.4 C) (Temporal)   Ht '5\' 9"'$  (1.753 m)   Wt 189 lb (85.7 kg)   SpO2 99%   BMI 27.91 kg/m    CC:  Chief Complaint  Patient presents with   Hip Pain    Bilateral     Subjective:   HPI: Judy Price is a 62 y.o. female patient of Tawni Millers with history of prediabetes, chronic shoulder, neck and knee pain presenting on 05/24/2022 for Hip Pain (Bilateral/)  Today she reports  off and on pain flares on constant pain... started several weeks to month.  Bilateral lateral hips.  No radiation of pain to legs, no numbness or weakness.  No proceeding inkjury or fall.  Pain making it hard to go to sleep.  She has been using heating pad, voltaren gel, tylenol PM.  Has tried  ibuprofen.  No pain with walking.   She is on her feet all day at work.     Pain lying on sides.    No past back surgeries and hip surgeries.  Relevant past medical, surgical, family and social history reviewed and updated as indicated. Interim medical history since our last visit reviewed. Allergies and medications reviewed and updated. Outpatient Medications Prior to Visit  Medication Sig Dispense Refill   BIOTIN PO Take by mouth.     cholecalciferol (VITAMIN D3) 25 MCG (1000 UNIT) tablet Take 1,000 Units by mouth daily.     gabapentin (NEURONTIN) 300 MG capsule Take 1 capsule (300 mg total) by mouth at bedtime. For restless legs. 90 capsule 2   Multiple Vitamin (MULTIVITAMIN WITH MINERALS) TABS tablet Take 1 tablet by mouth daily.     PARoxetine (PAXIL-CR) 12.5 MG 24 hr tablet TAKE 1 TABLET BY MOUTH ONCE DAILY FOR  HOT  FLASHES 90 tablet 2   doxycycline (VIBRA-TABS) 100 MG tablet Take 1 tablet (100 mg total) by mouth 2 (two) times daily. 20 tablet 0   trimethoprim-polymyxin b (POLYTRIM) ophthalmic solution Place 1 drop into both eyes every 4 (four)  hours. 10 mL 0   No facility-administered medications prior to visit.     Per HPI unless specifically indicated in ROS section below Review of Systems  Constitutional:  Negative for fatigue and fever.  HENT:  Negative for congestion.   Eyes:  Negative for pain.  Respiratory:  Negative for cough and shortness of breath.   Cardiovascular:  Negative for chest pain, palpitations and leg swelling.  Gastrointestinal:  Negative for abdominal pain.  Genitourinary:  Negative for dysuria and vaginal bleeding.  Musculoskeletal:  Positive for arthralgias. Negative for back pain.  Neurological:  Negative for syncope, light-headedness and headaches.  Psychiatric/Behavioral:  Negative for dysphoric mood.    Objective:  BP 120/76   Pulse 64   Temp 97.6 F (36.4 C) (Temporal)   Ht '5\' 9"'$  (1.753 m)   Wt 189 lb (85.7 kg)   SpO2 99%   BMI 27.91 kg/m   Wt Readings from Last 3 Encounters:  05/24/22 189 lb (85.7 kg)  09/23/19 188 lb (85.3 kg)  01/11/19 185 lb (83.9 kg)      Physical Exam Constitutional:      General: She is not in acute distress.    Appearance: Normal appearance. She is well-developed. She is not ill-appearing or toxic-appearing.  HENT:     Head: Normocephalic.     Right Ear: Hearing, tympanic membrane, ear canal and external ear normal. Tympanic membrane is not erythematous, retracted or bulging.     Left Ear: Hearing, tympanic membrane, ear canal and external ear normal. Tympanic membrane is not erythematous, retracted or bulging.     Nose: No mucosal edema or rhinorrhea.     Right Sinus: No maxillary sinus tenderness or frontal sinus tenderness.     Left Sinus: No maxillary sinus tenderness or frontal sinus tenderness.     Mouth/Throat:     Pharynx: Uvula midline.  Eyes:     General: Lids are normal. Lids are everted, no foreign bodies appreciated.     Conjunctiva/sclera: Conjunctivae normal.     Pupils: Pupils are equal, round, and reactive to light.  Neck:      Thyroid: No thyroid mass or thyromegaly.     Vascular: No carotid bruit.     Trachea: Trachea normal.  Cardiovascular:     Rate and Rhythm: Normal rate and regular rhythm.     Pulses: Normal pulses.     Heart sounds: Normal heart sounds, S1 normal and S2 normal. No murmur heard.    No friction rub. No gallop.  Pulmonary:     Effort: Pulmonary effort is normal. No tachypnea or respiratory distress.     Breath sounds: Normal breath sounds. No decreased breath sounds, wheezing, rhonchi or rales.  Abdominal:     General: Bowel sounds are normal.     Palpations: Abdomen is soft.     Tenderness: There is no abdominal tenderness.  Musculoskeletal:     Cervical back: Normal range of motion and neck supple.     Right hip: Tenderness present. Normal range of motion. Normal strength.     Left hip: Tenderness present. No bony tenderness. Normal range of motion. Normal strength.     Comments: Pain primarily over bilateral trochanteric bursas but also ttp in sciatic notches bilaterally.  Negative piriformis test, very good ROM in bilateral hips  Skin:    General: Skin is warm and dry.     Findings: No rash.  Neurological:     Mental Status: She is alert.  Psychiatric:        Mood and Affect: Mood is not anxious or depressed.        Speech: Speech normal.        Behavior: Behavior normal. Behavior is cooperative.        Thought Content: Thought content normal.        Judgment: Judgment normal.       Results for orders placed or performed in visit on 01/16/22  Lipid panel  Result Value Ref Range   Cholesterol 199 0 - 200 mg/dL   Triglycerides 57.0 0.0 - 149.0 mg/dL   HDL 77.60 >39.00 mg/dL   VLDL 11.4 0.0 - 40.0 mg/dL   LDL Cholesterol 110 (H) 0 - 99 mg/dL   Total CHOL/HDL Ratio 3    NonHDL 121.83   Hemoglobin A1c  Result Value Ref Range   Hgb A1c MFr Bld 5.9 4.6 - 6.5 %  Comprehensive metabolic panel  Result Value Ref Range   Sodium 141 135 - 145 mEq/L   Potassium 4.6 3.5 - 5.1  mEq/L   Chloride 105 96 - 112 mEq/L   CO2 29 19 - 32 mEq/L   Glucose, Bld 95 70 - 99 mg/dL   BUN 21 6 - 23 mg/dL   Creatinine, Ser  0.79 0.40 - 1.20 mg/dL   Total Bilirubin 0.5 0.2 - 1.2 mg/dL   Alkaline Phosphatase 53 39 - 117 U/L   AST 18 0 - 37 U/L   ALT 15 0 - 35 U/L   Total Protein 7.0 6.0 - 8.3 g/dL   Albumin 4.2 3.5 - 5.2 g/dL   GFR 79.90 >60.00 mL/min   Calcium 9.7 8.4 - 10.5 mg/dL  CBC  Result Value Ref Range   WBC 4.6 4.0 - 10.5 K/uL   RBC 4.85 3.87 - 5.11 Mil/uL   Platelets 218.0 150.0 - 400.0 K/uL   Hemoglobin 13.1 12.0 - 15.0 g/dL   HCT 40.9 36.0 - 46.0 %   MCV 84.3 78.0 - 100.0 fl   MCHC 32.0 30.0 - 36.0 g/dL   RDW 13.8 11.5 - 15.5 %    Assessment and Plan  Bilateral hip pain Assessment & Plan:  Acute.. Likely trochanteric bursitis vs spinal stenosis.  She will start meloxicam daily... does not like taking medications  ( refused prednisone taper) so would like to go ahead and get on schedule to be considered by Dr. Lorelei Pont for injection in hips.  Given home PT exercise and will apply ice.   No red flags for neurogenic claudication.   Other orders -     Meloxicam; Take 1 tablet (15 mg total) by mouth daily.  Dispense: 30 tablet; Refill: 0    No follow-ups on file.   Eliezer Lofts, MD

## 2022-05-24 NOTE — Patient Instructions (Signed)
Start meloxicam 15 mg daily.  Hold all other anti-inflammatories.  Start home PT.  Apply ice to area.  If not improving make an appt to see Dr, Lorelei Pont.

## 2022-05-29 ENCOUNTER — Ambulatory Visit: Payer: BC Managed Care – PPO | Admitting: Family Medicine

## 2022-06-04 NOTE — Progress Notes (Unsigned)
    Judy Windsor T. Banner Huckaba, MD, [place] at [facility] [place], [address]  Phone: [phone]  FAX: [phone]  Judy Price - 62 y.o. female  MRN [id number]  Date of Birth: Feb 13, 1961  Date: 06/05/2022  PCP: Pleas Koch, NP  Referral: Pleas Koch, NP  No chief complaint on file.  Subjective:   Judy Price is a 62 y.o. very pleasant female patient with There is no height or weight on file to calculate BMI. who presents with the following:  She is a pleasant lady who I saw 7 years ago with some shoulder pain, she presents today with bilateral lateral hip pain consistent with trochanteric bursitis.    Review of Systems is noted in the HPI, as appropriate  Objective:   There were no vitals taken for this visit.  GEN: No acute distress; alert,appropriate. PULM: Breathing comfortably in no respiratory distress PSYCH: Normally interactive.   Laboratory and Imaging Data:  Assessment and Plan:   ***

## 2022-06-05 ENCOUNTER — Ambulatory Visit: Payer: BC Managed Care – PPO | Admitting: Family Medicine

## 2022-06-05 ENCOUNTER — Encounter: Payer: Self-pay | Admitting: Family Medicine

## 2022-06-05 VITALS — BP 130/80 | HR 65 | Temp 97.4°F | Ht 69.0 in

## 2022-06-05 DIAGNOSIS — M7061 Trochanteric bursitis, right hip: Secondary | ICD-10-CM | POA: Diagnosis not present

## 2022-06-05 DIAGNOSIS — M7062 Trochanteric bursitis, left hip: Secondary | ICD-10-CM

## 2022-06-05 MED ORDER — TRIAMCINOLONE ACETONIDE 40 MG/ML IJ SUSP
40.0000 mg | Freq: Once | INTRAMUSCULAR | Status: AC
  Administered 2022-06-05: 40 mg via INTRA_ARTICULAR

## 2022-09-03 ENCOUNTER — Ambulatory Visit: Payer: BC Managed Care – PPO | Admitting: Primary Care

## 2022-09-10 ENCOUNTER — Other Ambulatory Visit: Payer: Self-pay | Admitting: Primary Care

## 2022-09-10 DIAGNOSIS — G2581 Restless legs syndrome: Secondary | ICD-10-CM

## 2022-09-17 ENCOUNTER — Ambulatory Visit: Payer: BC Managed Care – PPO | Admitting: Primary Care

## 2022-09-24 ENCOUNTER — Ambulatory Visit: Payer: BC Managed Care – PPO | Admitting: Primary Care

## 2022-10-07 ENCOUNTER — Ambulatory Visit: Payer: BC Managed Care – PPO | Admitting: Primary Care

## 2022-11-17 ENCOUNTER — Telehealth: Payer: BC Managed Care – PPO | Admitting: Family

## 2022-11-17 DIAGNOSIS — J069 Acute upper respiratory infection, unspecified: Secondary | ICD-10-CM

## 2022-11-17 MED ORDER — CETIRIZINE HCL 10 MG PO TABS: 10.0000 mg | ORAL_TABLET | Freq: Every day | ORAL | 1 refills | Status: DC

## 2022-11-17 MED ORDER — FLUTICASONE PROPIONATE 50 MCG/ACT NA SUSP: 2.0000 | Freq: Every day | NASAL | 6 refills | Status: DC

## 2022-11-17 NOTE — Progress Notes (Signed)

## 2022-11-18 ENCOUNTER — Telehealth: Payer: BC Managed Care – PPO

## 2022-12-04 ENCOUNTER — Other Ambulatory Visit: Payer: Self-pay | Admitting: Primary Care

## 2022-12-04 DIAGNOSIS — R232 Flushing: Secondary | ICD-10-CM

## 2022-12-04 NOTE — Telephone Encounter (Signed)
Lvmtcb

## 2022-12-04 NOTE — Telephone Encounter (Signed)
 Patient is due for CPE/follow up in early November, this will be required prior to any further refills.  Please schedule, thank you!

## 2022-12-12 ENCOUNTER — Other Ambulatory Visit: Payer: Self-pay | Admitting: Primary Care

## 2022-12-12 DIAGNOSIS — G2581 Restless legs syndrome: Secondary | ICD-10-CM

## 2023-01-12 IMAGING — MG MM DIGITAL SCREENING BILAT W/ TOMO AND CAD
6 of 10 series · 6 of 30 positions shown · non-contrast
Comparison: Previous exam(s).

CLINICAL DATA: Screening.

EXAM:
DIGITAL SCREENING BILATERAL MAMMOGRAM WITH TOMOSYNTHESIS AND CAD
TECHNIQUE: Bilateral screening digital craniocaudal and mediolateral oblique
mammograms were obtained. Bilateral screening digital breast
tomosynthesis was performed. The images were evaluated with
computer-aided detection.

[R MLO synth-2D]
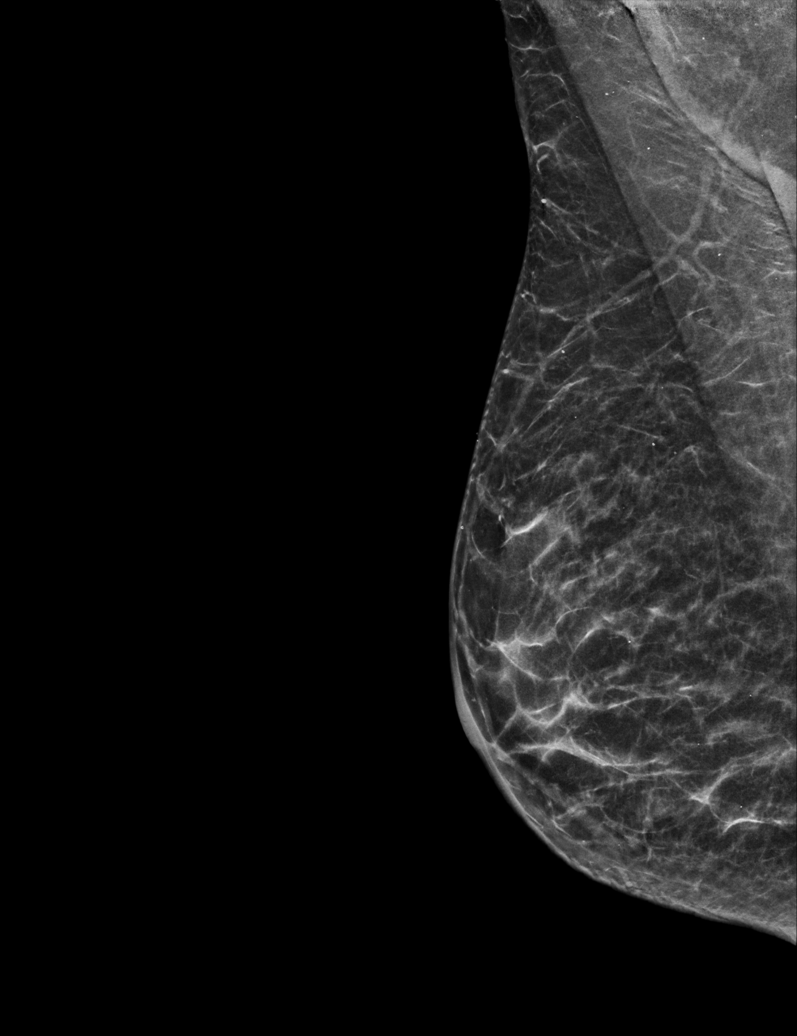

[L MLO synth-2D (1 of 2)]
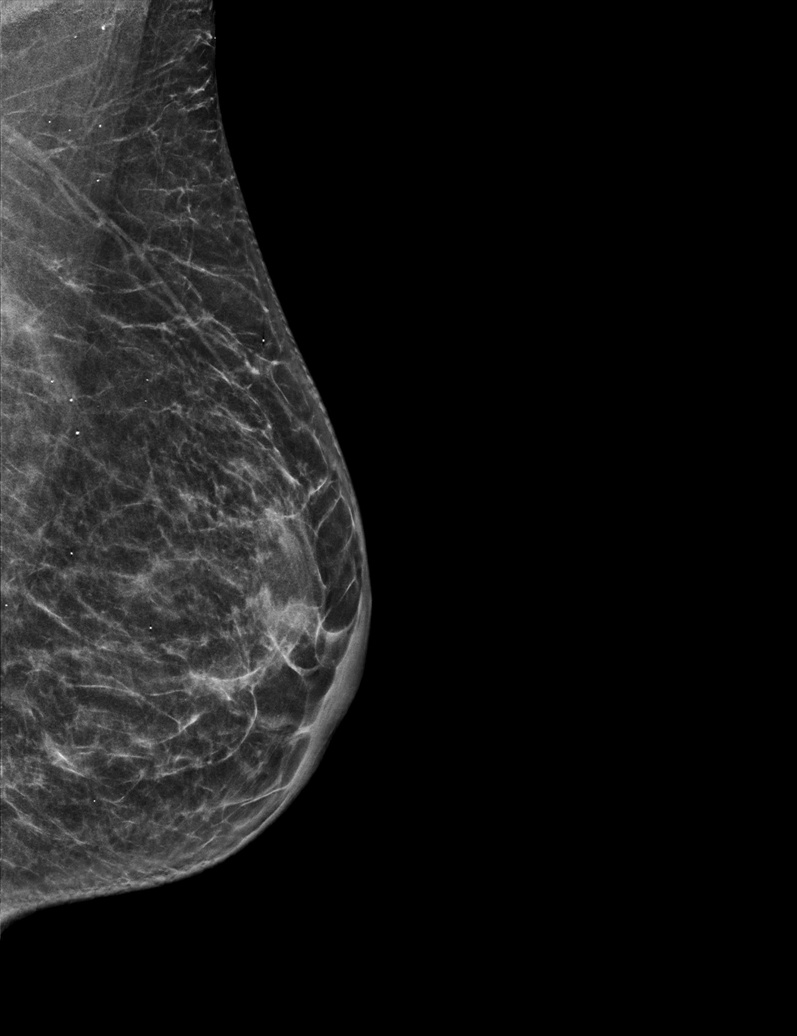

[L CC synth-2D]
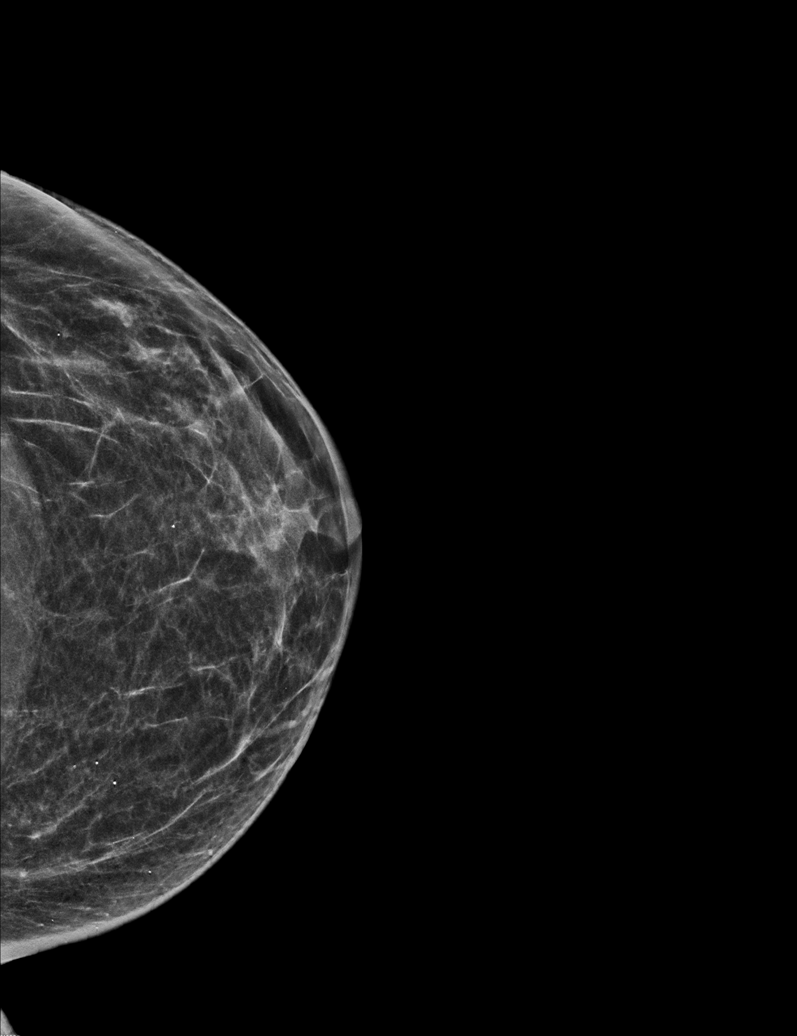

[L MLO synth-2D (2 of 2)]
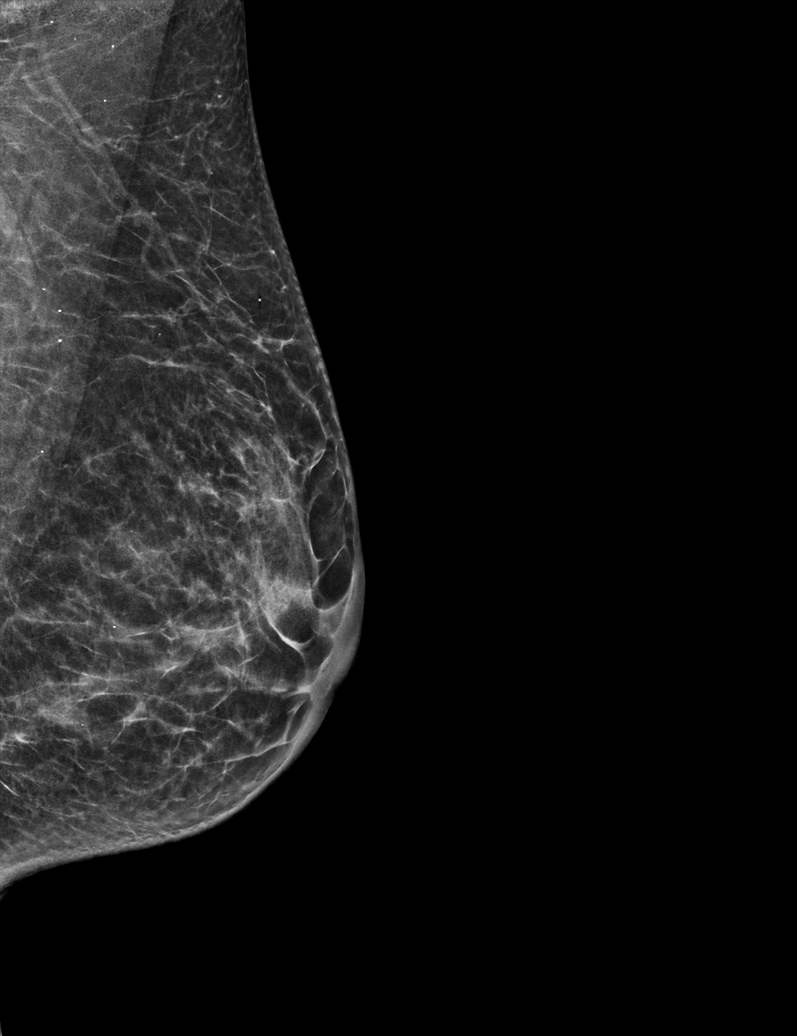

[R CC synth-2D]
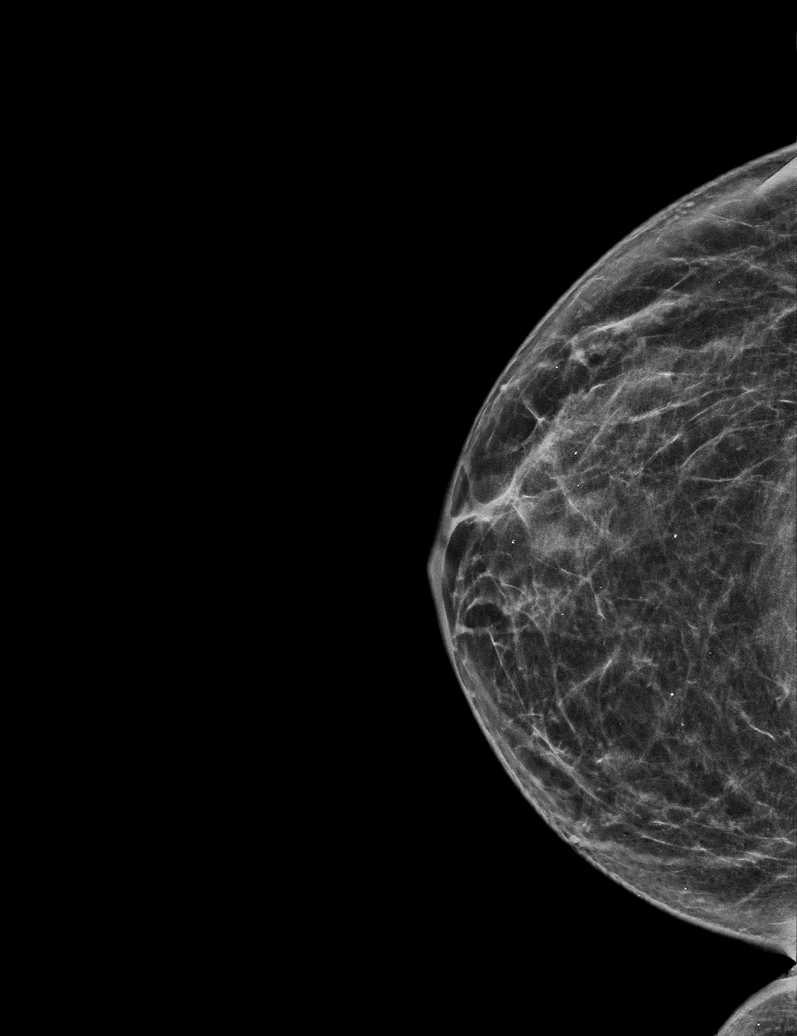

[L MLO tomo · tomo slice 29/57.0]
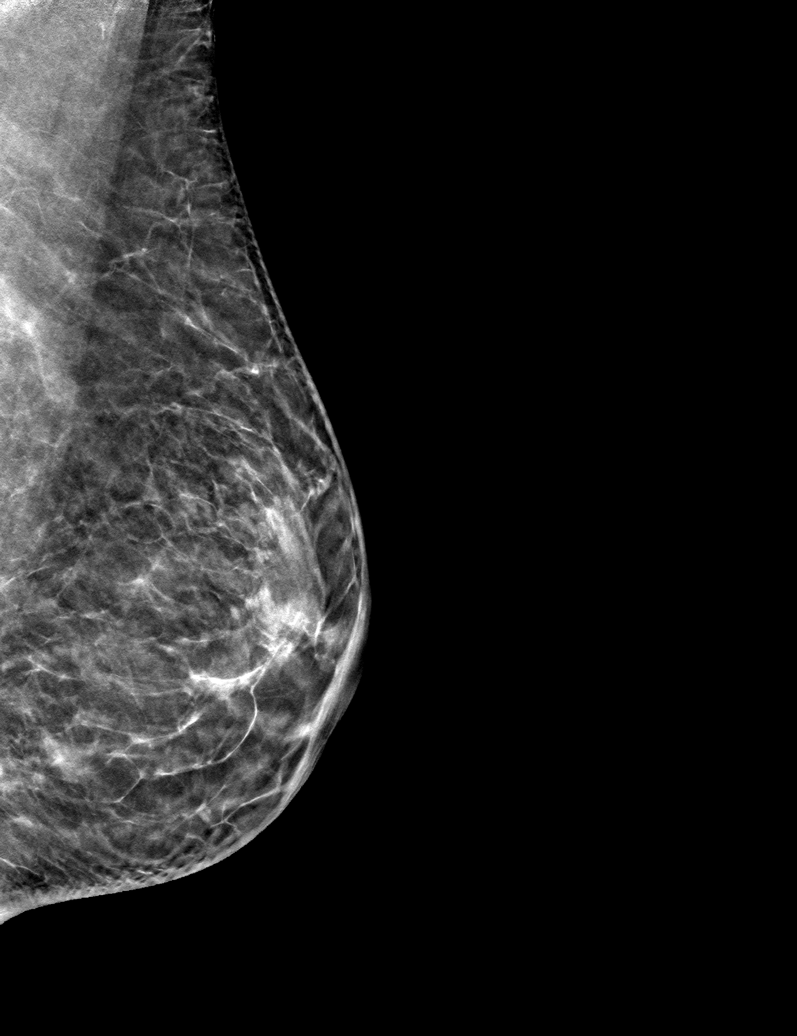

[6 of 30 positions shown; findings below may reference images not displayed]

ACR Breast Density Category b: There are scattered areas of
fibroglandular density.
FINDINGS: There are no findings suspicious for malignancy.
IMPRESSION: No mammographic evidence of malignancy. A result letter of this
screening mammogram will be mailed directly to the patient.

RECOMMENDATION:
Screening mammogram in one year. (Code:51-O-LD2)

BI-RADS CATEGORY  1: Negative.

## 2023-01-29 ENCOUNTER — Encounter: Payer: BC Managed Care – PPO | Admitting: Primary Care

## 2023-02-06 ENCOUNTER — Encounter: Payer: BC Managed Care – PPO | Admitting: Primary Care

## 2023-02-09 DIAGNOSIS — H539 Unspecified visual disturbance: Secondary | ICD-10-CM

## 2023-02-12 ENCOUNTER — Ambulatory Visit: Payer: BC Managed Care – PPO | Admitting: Primary Care

## 2023-02-18 ENCOUNTER — Encounter: Payer: Self-pay | Admitting: *Deleted

## 2023-02-19 ENCOUNTER — Encounter: Payer: BC Managed Care – PPO | Admitting: Primary Care

## 2023-02-25 ENCOUNTER — Ambulatory Visit: Payer: BC Managed Care – PPO | Admitting: Primary Care

## 2023-02-25 ENCOUNTER — Encounter: Payer: Self-pay | Admitting: Primary Care

## 2023-02-25 VITALS — BP 146/78 | HR 58 | Temp 97.5°F | Ht 69.0 in

## 2023-02-25 DIAGNOSIS — Z1231 Encounter for screening mammogram for malignant neoplasm of breast: Secondary | ICD-10-CM

## 2023-02-25 DIAGNOSIS — Z1211 Encounter for screening for malignant neoplasm of colon: Secondary | ICD-10-CM

## 2023-02-25 DIAGNOSIS — G2581 Restless legs syndrome: Secondary | ICD-10-CM

## 2023-02-25 DIAGNOSIS — Z9884 Bariatric surgery status: Secondary | ICD-10-CM

## 2023-02-25 DIAGNOSIS — Z0000 Encounter for general adult medical examination without abnormal findings: Secondary | ICD-10-CM | POA: Diagnosis not present

## 2023-02-25 DIAGNOSIS — F418 Other specified anxiety disorders: Secondary | ICD-10-CM

## 2023-02-25 DIAGNOSIS — R232 Flushing: Secondary | ICD-10-CM

## 2023-02-25 DIAGNOSIS — R7303 Prediabetes: Secondary | ICD-10-CM

## 2023-02-25 DIAGNOSIS — D509 Iron deficiency anemia, unspecified: Secondary | ICD-10-CM

## 2023-02-25 LAB — IBC + FERRITIN
Ferritin: 21.6 ng/mL (ref 10.0–291.0)
Iron: 91 ug/dL (ref 42–145)
Saturation Ratios: 23.6 % (ref 20.0–50.0)
TIBC: 385 ug/dL (ref 250.0–450.0)
Transferrin: 275 mg/dL (ref 212.0–360.0)

## 2023-02-25 LAB — COMPREHENSIVE METABOLIC PANEL
ALT: 24 U/L (ref 0–35)
AST: 18 U/L (ref 0–37)
Albumin: 4.1 g/dL (ref 3.5–5.2)
Alkaline Phosphatase: 54 U/L (ref 39–117)
BUN: 19 mg/dL (ref 6–23)
CO2: 33 meq/L — ABNORMAL HIGH (ref 19–32)
Calcium: 9.6 mg/dL (ref 8.4–10.5)
Chloride: 106 meq/L (ref 96–112)
Creatinine, Ser: 0.77 mg/dL (ref 0.40–1.20)
GFR: 81.76 mL/min (ref >60.00)
Glucose, Bld: 95 mg/dL (ref 70–99)
Potassium: 5.1 meq/L (ref 3.5–5.1)
Sodium: 143 meq/L (ref 135–145)
Total Bilirubin: 0.5 mg/dL (ref 0.2–1.2)
Total Protein: 6.6 g/dL (ref 6.0–8.3)

## 2023-02-25 LAB — CBC
HCT: 40.5 % (ref 36.0–46.0)
Hemoglobin: 13.3 g/dL (ref 12.0–15.0)
MCHC: 32.7 g/dL (ref 30.0–36.0)
MCV: 87.5 fL (ref 78.0–100.0)
Platelets: 213 10*3/uL (ref 150.0–400.0)
RBC: 4.63 Mil/uL (ref 3.87–5.11)
RDW: 13.9 % (ref 11.5–15.5)
WBC: 3.9 10*3/uL — ABNORMAL LOW (ref 4.0–10.5)

## 2023-02-25 LAB — LIPID PANEL
Cholesterol: 203 mg/dL — ABNORMAL HIGH (ref 0–200)
HDL: 70.9 mg/dL (ref >39.00)
LDL Cholesterol: 120 mg/dL — ABNORMAL HIGH (ref 0–99)
NonHDL: 132.45
Total CHOL/HDL Ratio: 3
Triglycerides: 62 mg/dL (ref 0.0–149.0)
VLDL: 12.4 mg/dL (ref 0.0–40.0)

## 2023-02-25 LAB — VITAMIN B12: Vitamin B-12: 496 pg/mL (ref 211–911)

## 2023-02-25 LAB — VITAMIN D 25 HYDROXY (VIT D DEFICIENCY, FRACTURES): VITD: 33.21 ng/mL (ref 30.00–100.00)

## 2023-02-25 LAB — HEMOGLOBIN A1C: Hgb A1c MFr Bld: 5.9 % (ref 4.6–6.5)

## 2023-02-25 NOTE — Assessment & Plan Note (Signed)
Controlled.  Continue paroxetine 12.5 mg daily.

## 2023-02-25 NOTE — Assessment & Plan Note (Signed)
Labs pending today including vitamin B12, vitamin D, CBC, CMP, A1c, lipids.

## 2023-02-25 NOTE — Assessment & Plan Note (Signed)
Controlled.  Continue gabapentin 300 mg at bedtime.

## 2023-02-25 NOTE — Assessment & Plan Note (Signed)
Controlled.  No concerns today. Continue monitor.

## 2023-02-25 NOTE — Assessment & Plan Note (Signed)
Repeat iron levels pending.

## 2023-02-25 NOTE — Assessment & Plan Note (Signed)
Repeat A1c pending. 

## 2023-02-25 NOTE — Patient Instructions (Signed)
Stop by the lab prior to leaving today. I will notify you of your results once received.   Call [facility] to schedule your mammogram.   Complete the Cologuard kit once received.  It was a pleasure to see you today!

## 2023-02-25 NOTE — Progress Notes (Signed)
Subjective:    Patient ID: Judy Price, female    DOB: 1960/12/07, 62 y.o.   MRN: [id number]  HPI  Judy Price is a very pleasant 62 y.o. female who presents today for complete physical and follow up of chronic conditions.  Immunizations: -Tetanus: Completed > 10 years ago. -Influenza: Declines influenza vaccine. -Shingles: Completed Shingrix series  Diet: Fair diet.  Exercise: No regular exercise.  Eye exam: Completes recently  Dental exam: Completes semi-annually  Pap Smear: Hysterectomy Mammogram: Completed in January 2024  Colonoscopy: Completed in 2014, due 2024. She declines colonoscopy, agrees for Cologuard.  BP Readings from Last 3 Encounters:  02/25/23 (!) 146/78  06/05/22 130/80  05/24/22 120/76       Review of Systems  Constitutional:  Negative for unexpected weight change.  HENT:  Negative for rhinorrhea.   Respiratory:  Negative for cough and shortness of breath.   Cardiovascular:  Negative for chest pain.  Gastrointestinal:  Negative for constipation and diarrhea.  Genitourinary:  Negative for difficulty urinating.  Musculoskeletal:  Negative for arthralgias and myalgias.  Skin:  Negative for rash.  Allergic/Immunologic: Negative for environmental allergies.  Neurological:  Negative for dizziness, numbness and headaches.  Psychiatric/Behavioral:  The patient is not nervous/anxious.          Past Medical History:  Diagnosis Date   Absolute anemia 10/08/2019   Allergic conjunctivitis 06/24/2018   Chicken pox    Eye infection, bilateral 11/2018   History of UTI    Iron deficiency anemia    in the past   Shingles 03/19/2018   also had a bout in september 2020. nothing active now    Social History   Socioeconomic History   Marital status: Married    Spouse name: joaquin - husband   Number of children: Not on file   Years of education: Not on file   Highest education level: Not on file  Occupational History   Occupation: not  currently working    Comment: HOMEMAKER  Tobacco Use   Smoking status: Never   Smokeless tobacco: Never  Vaping Use   Vaping status: Never Used  Substance and Sexual Activity   Alcohol use: No    Alcohol/week: 0.0 standard drinks of alcohol   Drug use: No   Sexual activity: Not on file  Other Topics Concern   Not on file  Social History Narrative   Married.   Moved from [place].   One child, two grandchildren Home maker   Enjoys decorating, Nutritional therapist.   Social Determinants of Health   Financial Resource Strain: Not on file  Food Insecurity: Not on file  Transportation Needs: Not on file  Physical Activity: Not on file  Stress: Not on file  Social Connections: Not on file  Intimate Partner Violence: Not on file    Past Surgical History:  Procedure Laterality Date   ABDOMINAL HYSTERECTOMY  2010   GASTRIC BYPASS  2008   SHOULDER ARTHROSCOPY WITH SUBACROMIAL DECOMPRESSION AND OPEN ROTATOR C Right 01/11/2019   Procedure: SHOULDER ARTHROSCOPY WITH SUBACROMIAL DECOMPRESSION ,OPEN BICEP TENDON REPAIR, MINI OPEN REGENTEN PATCH APPLICATION;  Surgeon: Signa Kell, MD;  Location: [facility] ORS;  Service: Orthopedics;  Laterality: Right;   TONSILLECTOMY AND ADENOIDECTOMY      Family History  Problem Relation Age of Onset   Arthritis Mother    Cancer Mother        Breast tumors   Hyperlipidemia Mother    Heart disease Mother    Stroke Mother  Hypertension Mother    Heart attack Mother    Hyperlipidemia Father    Heart disease Father    Stroke Father    Heart attack Father    Uterine cancer Sister    Breast cancer Maternal Aunt    Breast cancer Maternal Grandmother 33   Arthritis Maternal Grandmother    Cancer Maternal Grandmother        Breast   Hyperlipidemia Maternal Grandmother    Heart disease Maternal Grandmother    Stroke Maternal Grandmother    Hypertension Maternal Grandmother    Hyperlipidemia Maternal Grandfather    Multiple myeloma Maternal  Grandfather    Hyperlipidemia Paternal Grandmother    Hyperlipidemia Paternal Grandfather     Allergies  Allergen Reactions   Codeine Nausea Only    Current Outpatient Medications on File Prior to Visit  Medication Sig Dispense Refill   gabapentin (NEURONTIN) 300 MG capsule TAKE 1 CAPSULE BY MOUTH AT BEDTIME FOR  RESTLESS  LEGS 90 capsule 0   Multiple Vitamin (MULTIVITAMIN WITH MINERALS) TABS tablet Take 1 tablet by mouth daily.     PARoxetine (PAXIL-CR) 12.5 MG 24 hr tablet TAKE 1 TABLET BY MOUTH ONCE DAILY FOR HOT FLASHES 90 tablet 0   No current facility-administered medications on file prior to visit.    BP (!) 146/78   Pulse (!) 58   Temp (!) 97.5 F (36.4 C) (Temporal)   Ht 5\' 9"  (1.753 m)   SpO2 99%   BMI 27.91 kg/m  Objective:   Physical Exam HENT:     Right Ear: Tympanic membrane and ear canal normal.     Left Ear: Tympanic membrane and ear canal normal.  Eyes:     Pupils: Pupils are equal, round, and reactive to light.  Cardiovascular:     Rate and Rhythm: Normal rate and regular rhythm.  Pulmonary:     Effort: Pulmonary effort is normal.     Breath sounds: Normal breath sounds.  Abdominal:     General: Bowel sounds are normal.     Palpations: Abdomen is soft.     Tenderness: There is no abdominal tenderness.  Musculoskeletal:        General: Normal range of motion.     Cervical back: Neck supple.  Skin:    General: Skin is warm and dry.  Neurological:     Mental Status: She is alert and oriented to person, place, and time.     Cranial Nerves: No cranial nerve deficit.     Deep Tendon Reflexes:     Reflex Scores:      Patellar reflexes are 2+ on the right side and 2+ on the left side. Psychiatric:        Mood and Affect: Mood normal.           Assessment & Plan:  Preventative health care Assessment & Plan: Declines influenza and tetanus vaccines. Mammogram due in January 2025, orders placed. Colonoscopy due, she declines colonoscopy but  opts for Cologuard.  Orders placed.  Discussed the importance of a healthy diet and regular exercise in order for weight loss, and to reduce the risk of further co-morbidity.  Exam stable. Labs pending.  Follow up in 1 year for repeat physical.    Iron deficiency anemia, unspecified iron deficiency anemia type -     CBC -     IBC + Ferritin  Prediabetes Assessment & Plan: Repeat A1c pending.  Orders: -     Lipid panel -  Hemoglobin A1c -     Comprehensive metabolic panel  History of gastric bypass Assessment & Plan: Labs pending today including vitamin B12, vitamin D, CBC, CMP, A1c, lipids.  Orders: -     VITAMIN D 25 Hydroxy (Vit-D Deficiency, Fractures) -     Vitamin B12  Screening mammogram for breast cancer -     3D Screening Mammogram, Left and Right; Future  Screening for colon cancer -     Cologuard  Hot flashes Assessment & Plan: Controlled.  Continue paroxetine 12.5 mg daily.   Restless leg syndrome Assessment & Plan: Controlled.  Continue gabapentin 300 mg at bedtime.   Situational anxiety Assessment & Plan: Controlled.  No concerns today. Continue monitor.         Doreene Nest, NP

## 2023-02-25 NOTE — Assessment & Plan Note (Signed)
Declines influenza and tetanus vaccines. Mammogram due in January 2025, orders placed. Colonoscopy due, she declines colonoscopy but opts for Cologuard.  Orders placed.  Discussed the importance of a healthy diet and regular exercise in order for weight loss, and to reduce the risk of further co-morbidity.  Exam stable. Labs pending.  Follow up in 1 year for repeat physical.

## 2023-03-01 ENCOUNTER — Other Ambulatory Visit: Payer: Self-pay | Admitting: Primary Care

## 2023-03-01 DIAGNOSIS — G2581 Restless legs syndrome: Secondary | ICD-10-CM

## 2023-03-01 DIAGNOSIS — R232 Flushing: Secondary | ICD-10-CM

## 2023-03-03 ENCOUNTER — Other Ambulatory Visit: Payer: Self-pay

## 2023-03-03 DIAGNOSIS — G2581 Restless legs syndrome: Secondary | ICD-10-CM

## 2023-03-03 DIAGNOSIS — R232 Flushing: Secondary | ICD-10-CM

## 2023-05-04 ENCOUNTER — Telehealth: Payer: BC Managed Care – PPO | Admitting: Physician Assistant

## 2023-05-04 DIAGNOSIS — J019 Acute sinusitis, unspecified: Secondary | ICD-10-CM | POA: Diagnosis not present

## 2023-05-04 DIAGNOSIS — B9689 Other specified bacterial agents as the cause of diseases classified elsewhere: Secondary | ICD-10-CM | POA: Diagnosis not present

## 2023-05-04 MED ORDER — AMOXICILLIN-POT CLAVULANATE 875-125 MG PO TABS: 1.0000 | ORAL_TABLET | Freq: Two times a day (BID) | ORAL | 0 refills | Status: DC

## 2023-05-04 NOTE — Progress Notes (Signed)
E-Visit for Sinus Problems  We are sorry that you are not feeling well.  Here is how we plan to help!  Based on what you have shared with me it looks like you have sinusitis.  Sinusitis is inflammation and infection in the sinus cavities of the head.  Based on your presentation I believe you most likely have Acute Bacterial Sinusitis.  This is an infection caused by bacteria and is treated with antibiotics. I have prescribed Augmentin 875mg/125mg one tablet twice daily with food, for 7 days. You may use an oral decongestant such as Mucinex D or if you have glaucoma or high blood pressure use plain Mucinex. Saline nasal spray help and can safely be used as often as needed for congestion.  If you develop worsening sinus pain, fever or notice severe headache and vision changes, or if symptoms are not better after completion of antibiotic, please schedule an appointment with a health care provider.    Sinus infections are not as easily transmitted as other respiratory infection, however we still recommend that you avoid close contact with loved ones, especially the very [place] and elderly.  Remember to wash your hands thoroughly throughout the day as this is the number one way to prevent the spread of infection!  Home Care: Only take medications as instructed by your medical team. Complete the entire course of an antibiotic. Do not take these medications with alcohol. A steam or ultrasonic humidifier can help congestion.  You can place a towel over your head and breathe in the steam from hot water coming from a faucet. Avoid close contacts especially the very [place] and the elderly. Cover your mouth when you cough or sneeze. Always remember to wash your hands.  Get Help Right Away If: You develop worsening fever or sinus pain. You develop a severe head ache or visual changes. Your symptoms persist after you have completed your treatment plan.  Make sure you Understand these instructions. Will watch  your condition. Will get help right away if you are not doing well or get worse.  Thank you for choosing an e-visit.  Your e-visit answers were reviewed by a board certified advanced clinical practitioner to complete your personal care plan. Depending upon the condition, your plan could have included both over the counter or prescription medications.  Please review your pharmacy choice. Make sure the pharmacy is open so you can pick up prescription now. If there is a problem, you may contact your provider through MyChart messaging and have the prescription routed to another pharmacy.  Your safety is important to us. If you have drug allergies check your prescription carefully.   For the next 24 hours you can use MyChart to ask questions about today's visit, request a non-urgent call back, or ask for a work or school excuse. You will get an email in the next two days asking about your experience. I hope that your e-visit has been valuable and will speed your recovery.   I have spent 5 minutes in review of e-visit questionnaire, review and updating patient chart, medical decision making and response to patient.   Xoey Warmoth Z Ward, PA-C    

## 2023-06-23 ENCOUNTER — Telehealth: Payer: Self-pay

## 2023-06-23 NOTE — Telephone Encounter (Signed)
 Spoke to pt, pt states her & her husband cannot come in on our nv days, only Fridays. Pt states she'll visit [facility] for shot.

## 2023-06-23 NOTE — Telephone Encounter (Signed)
Did you mean to route this back to me?

## 2023-06-23 NOTE — Telephone Encounter (Signed)
 Copied from CRM [id number]. Topic: Appointments - Appointment Scheduling >> Jun 23, 2023 10:36 AM Truddie Crumble wrote: Patient/patient representative is calling to schedule an appointment. Refer to attachments for appointment information.   Patient called stating she would like to make an appointment for her and her husband for a tdap shot  CB [phone]

## 2023-06-23 NOTE — Telephone Encounter (Signed)
 Yes, okay to schedule NV for TDAP

## 2023-06-23 NOTE — Telephone Encounter (Signed)
 Noted.

## 2023-06-23 NOTE — Telephone Encounter (Signed)
 Okay to schedule patient NV for tdap?

## 2023-09-23 ENCOUNTER — Encounter (INDEPENDENT_AMBULATORY_CARE_PROVIDER_SITE_OTHER): Payer: Self-pay

## 2023-09-23 DIAGNOSIS — L299 Pruritus, unspecified: Secondary | ICD-10-CM

## 2023-09-24 DIAGNOSIS — L299 Pruritus, unspecified: Secondary | ICD-10-CM | POA: Diagnosis not present

## 2023-09-24 MED ORDER — TRIAMCINOLONE ACETONIDE 0.1 % EX CREA: 1.0000 | TOPICAL_CREAM | Freq: Two times a day (BID) | CUTANEOUS | 0 refills | Status: DC | PRN

## 2023-09-24 NOTE — Telephone Encounter (Signed)

## 2023-10-10 ENCOUNTER — Encounter

## 2023-10-17 ENCOUNTER — Encounter

## 2023-11-07 ENCOUNTER — Encounter

## 2023-11-26 ENCOUNTER — Ambulatory Visit
Admission: RE | Admit: 2023-11-26 | Discharge: 2023-11-26 | Disposition: A | Discharge status: Home / Self Care | Source: Ambulatory Visit | Attending: Primary Care | Admitting: Primary Care

## 2023-11-26 DIAGNOSIS — Z1231 Encounter for screening mammogram for malignant neoplasm of breast: Secondary | ICD-10-CM | POA: Insufficient documentation

## 2023-12-01 ENCOUNTER — Ambulatory Visit: Payer: Self-pay | Admitting: Primary Care

## 2023-12-13 ENCOUNTER — Telehealth: Admitting: Nurse Practitioner

## 2023-12-13 DIAGNOSIS — B9689 Other specified bacterial agents as the cause of diseases classified elsewhere: Secondary | ICD-10-CM | POA: Diagnosis not present

## 2023-12-13 DIAGNOSIS — J019 Acute sinusitis, unspecified: Secondary | ICD-10-CM | POA: Diagnosis not present

## 2023-12-13 MED ORDER — FLUTICASONE PROPIONATE 50 MCG/ACT NA SUSP: 2.0000 | Freq: Every day | NASAL | 0 refills | Status: AC

## 2023-12-13 MED ORDER — AMOXICILLIN-POT CLAVULANATE 875-125 MG PO TABS: 1.0000 | ORAL_TABLET | Freq: Two times a day (BID) | ORAL | 0 refills | Status: AC

## 2023-12-13 NOTE — Progress Notes (Signed)
 I have spent 5 minutes in review of e-visit questionnaire, review and updating patient chart, medical decision making and response to patient.   Claiborne Rigg, NP

## 2023-12-13 NOTE — Progress Notes (Signed)

## 2024-01-04 ENCOUNTER — Other Ambulatory Visit: Payer: Self-pay | Admitting: Nurse Practitioner

## 2024-01-04 DIAGNOSIS — B9689 Other specified bacterial agents as the cause of diseases classified elsewhere: Secondary | ICD-10-CM

## 2024-03-08 ENCOUNTER — Other Ambulatory Visit: Payer: Self-pay | Admitting: Primary Care

## 2024-03-08 DIAGNOSIS — R232 Flushing: Secondary | ICD-10-CM

## 2024-03-08 DIAGNOSIS — G2581 Restless legs syndrome: Secondary | ICD-10-CM

## 2024-03-09 NOTE — Telephone Encounter (Signed)
 Patient is due for CPE/follow up, this will be required prior to any further refills.  Please schedule, thank you!

## 2024-03-09 NOTE — Telephone Encounter (Signed)
 Noted

## 2024-03-09 NOTE — Telephone Encounter (Signed)
 Called and schedule pt for Cpe

## 2024-03-30 ENCOUNTER — Encounter: Admitting: Primary Care

## 2024-03-30 ENCOUNTER — Encounter: Payer: Self-pay | Admitting: Oncology

## 2024-04-02 ENCOUNTER — Encounter: Payer: Self-pay | Admitting: Oncology

## 2024-04-14 ENCOUNTER — Encounter: Payer: Self-pay | Admitting: Primary Care

## 2024-04-20 ENCOUNTER — Other Ambulatory Visit: Payer: Self-pay | Admitting: Primary Care

## 2024-04-20 ENCOUNTER — Encounter: Payer: Self-pay | Admitting: Oncology

## 2024-04-20 DIAGNOSIS — R232 Flushing: Secondary | ICD-10-CM

## 2024-04-20 DIAGNOSIS — G2581 Restless legs syndrome: Secondary | ICD-10-CM

## 2024-04-21 ENCOUNTER — Encounter: Payer: Self-pay | Admitting: Primary Care

## 2024-04-28 ENCOUNTER — Encounter: Admitting: Primary Care

## 2024-05-05 ENCOUNTER — Encounter: Admitting: Primary Care
